# Patient Record
Sex: Female | Born: 1985 | Race: Black or African American | Hispanic: No | Marital: Married | Smoking: Never smoker
Health system: Southern US, Community
[De-identification: ages and names within clinical notes are randomized; demographics above are authoritative.]

## PROBLEM LIST (undated history)

## (undated) DIAGNOSIS — K219 Gastro-esophageal reflux disease without esophagitis: Secondary | ICD-10-CM

## (undated) DIAGNOSIS — D500 Iron deficiency anemia secondary to blood loss (chronic): Secondary | ICD-10-CM

## (undated) DIAGNOSIS — Z973 Presence of spectacles and contact lenses: Secondary | ICD-10-CM

## (undated) DIAGNOSIS — F411 Generalized anxiety disorder: Secondary | ICD-10-CM

## (undated) DIAGNOSIS — D573 Sickle-cell trait: Secondary | ICD-10-CM

## (undated) DIAGNOSIS — N83202 Unspecified ovarian cyst, left side: Secondary | ICD-10-CM

## (undated) DIAGNOSIS — D563 Thalassemia minor: Secondary | ICD-10-CM

## (undated) DIAGNOSIS — Z8659 Personal history of other mental and behavioral disorders: Secondary | ICD-10-CM

## (undated) DIAGNOSIS — D259 Leiomyoma of uterus, unspecified: Secondary | ICD-10-CM

## (undated) DIAGNOSIS — Z8742 Personal history of other diseases of the female genital tract: Secondary | ICD-10-CM

## (undated) DIAGNOSIS — N920 Excessive and frequent menstruation with regular cycle: Secondary | ICD-10-CM

## (undated) DIAGNOSIS — IMO0002 Reserved for concepts with insufficient information to code with codable children: Secondary | ICD-10-CM

## (undated) DIAGNOSIS — N946 Dysmenorrhea, unspecified: Secondary | ICD-10-CM

## (undated) DIAGNOSIS — I10 Essential (primary) hypertension: Secondary | ICD-10-CM

## (undated) DIAGNOSIS — R87619 Unspecified abnormal cytological findings in specimens from cervix uteri: Secondary | ICD-10-CM

## (undated) HISTORY — PX: NO PAST SURGERIES: SHX2092

## (undated) HISTORY — PX: COLPOSCOPY: SHX161

---
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
## 2004-01-04 ENCOUNTER — Encounter: Admission: RE | Admit: 2004-01-04 | Discharge: 2004-01-04 | Payer: Self-pay | Admitting: Family Medicine

## 2004-02-14 ENCOUNTER — Encounter: Admission: RE | Admit: 2004-02-14 | Discharge: 2004-02-14 | Payer: Self-pay | Admitting: Family Medicine

## 2004-02-14 ENCOUNTER — Other Ambulatory Visit: Admission: RE | Admit: 2004-02-14 | Discharge: 2004-02-14 | Payer: Self-pay | Admitting: Family Medicine

## 2004-02-14 ENCOUNTER — Encounter (INDEPENDENT_AMBULATORY_CARE_PROVIDER_SITE_OTHER): Payer: Self-pay | Admitting: Specialist

## 2004-02-28 ENCOUNTER — Encounter: Admission: RE | Admit: 2004-02-28 | Discharge: 2004-02-28 | Payer: Self-pay | Admitting: Family Medicine

## 2004-07-11 ENCOUNTER — Ambulatory Visit: Payer: Self-pay | Admitting: Family Medicine

## 2004-07-11 ENCOUNTER — Encounter (INDEPENDENT_AMBULATORY_CARE_PROVIDER_SITE_OTHER): Payer: Self-pay | Admitting: *Deleted

## 2004-10-17 ENCOUNTER — Encounter: Payer: Self-pay | Admitting: Family Medicine

## 2004-10-17 ENCOUNTER — Ambulatory Visit: Payer: Self-pay | Admitting: Family Medicine

## 2005-02-13 ENCOUNTER — Ambulatory Visit: Payer: Self-pay | Admitting: Family Medicine

## 2005-08-21 ENCOUNTER — Ambulatory Visit: Payer: Self-pay | Admitting: Family Medicine

## 2005-09-24 ENCOUNTER — Ambulatory Visit: Payer: Self-pay | Admitting: Family Medicine

## 2005-09-24 ENCOUNTER — Encounter: Payer: Self-pay | Admitting: Family Medicine

## 2005-10-31 ENCOUNTER — Emergency Department (HOSPITAL_COMMUNITY): Admission: EM | Admit: 2005-10-31 | Discharge: 2005-10-31 | Payer: Self-pay | Admitting: Emergency Medicine

## 2006-07-10 ENCOUNTER — Emergency Department (HOSPITAL_COMMUNITY): Admission: EM | Admit: 2006-07-10 | Discharge: 2006-07-10 | Payer: Self-pay | Admitting: Emergency Medicine

## 2008-05-16 ENCOUNTER — Ambulatory Visit (HOSPITAL_COMMUNITY): Admission: RE | Admit: 2008-05-16 | Discharge: 2008-05-16 | Payer: Self-pay | Admitting: Obstetrics & Gynecology

## 2008-05-29 ENCOUNTER — Ambulatory Visit (HOSPITAL_COMMUNITY): Admission: RE | Admit: 2008-05-29 | Discharge: 2008-05-29 | Payer: Self-pay | Admitting: Obstetrics & Gynecology

## 2008-06-25 ENCOUNTER — Ambulatory Visit (HOSPITAL_COMMUNITY): Admission: RE | Admit: 2008-06-25 | Discharge: 2008-06-25 | Payer: Self-pay | Admitting: Obstetrics & Gynecology

## 2008-06-25 IMAGING — US US OB FOLLOW-UP
1 series · 14 of 28 positions shown · non-contrast
Comparison: none

OBSTETRICAL ULTRASOUND:
 This ultrasound was performed in The [HOSPITAL], and the [facility] report will be stored to [REDACTED] PACS.

[Series 1: us ob follow-up · 14 of 67 slices shown]
[im 3/67]
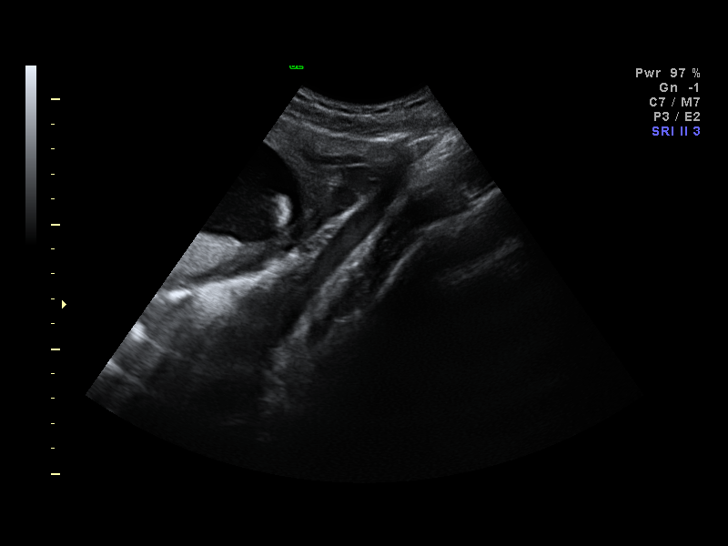
[im 8/67]
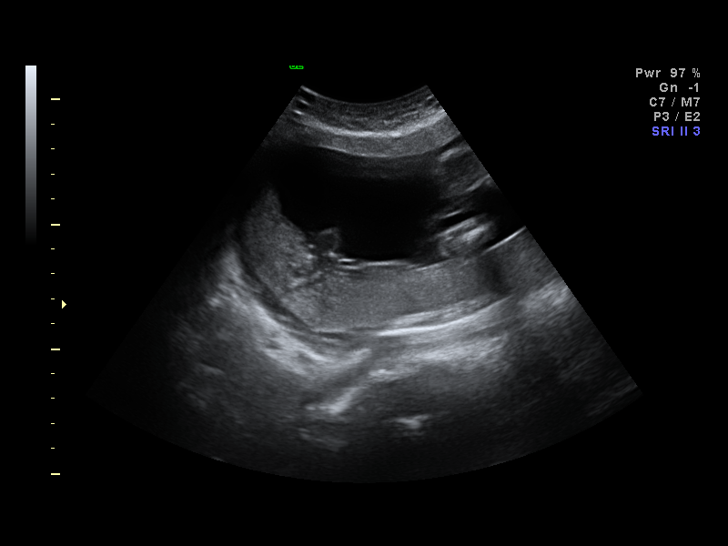
[im 13/67]
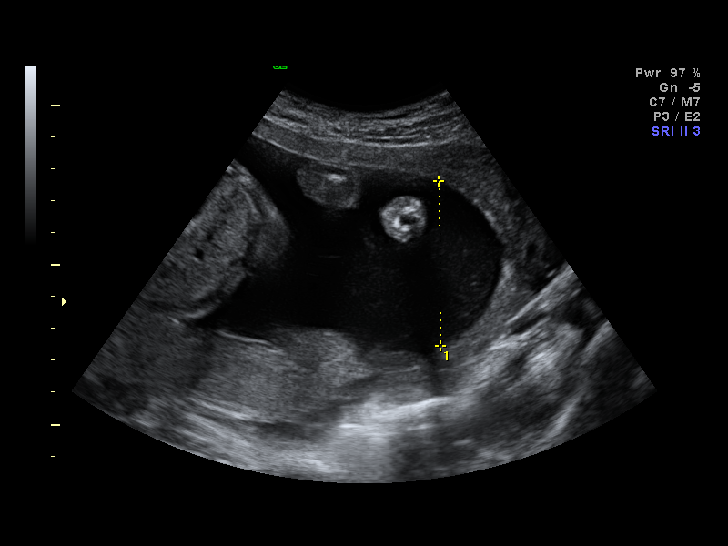
[im 18/67]
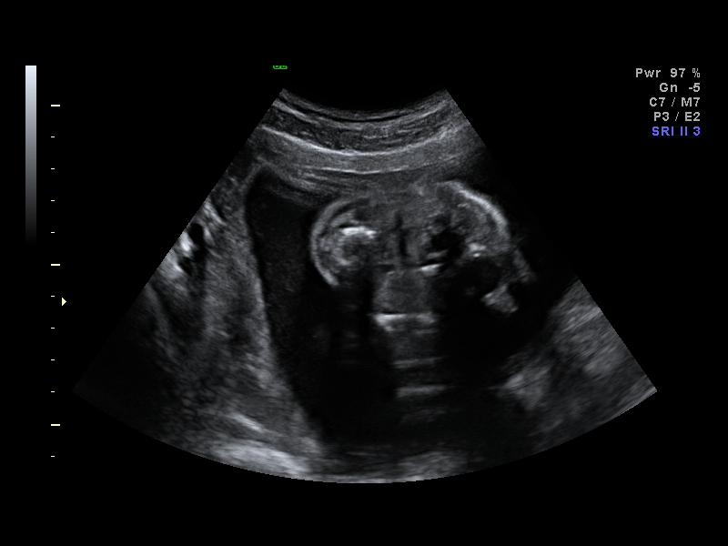
[im 23/67]
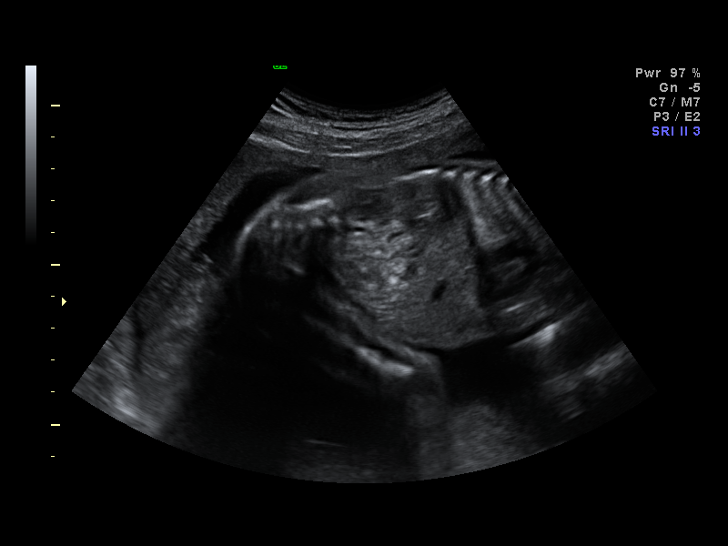
[im 27/67]
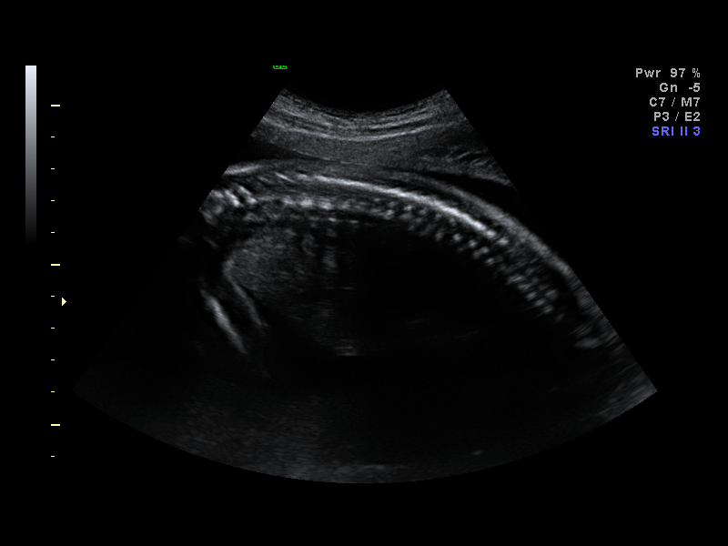
[im 32/67]
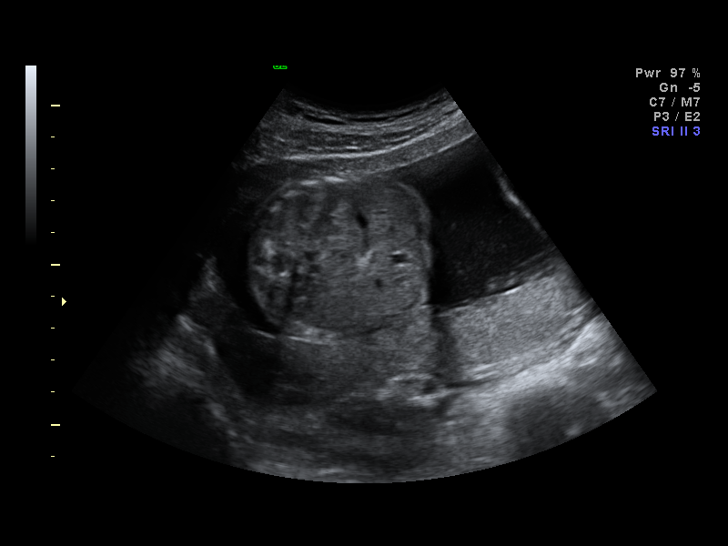
[im 37/67]
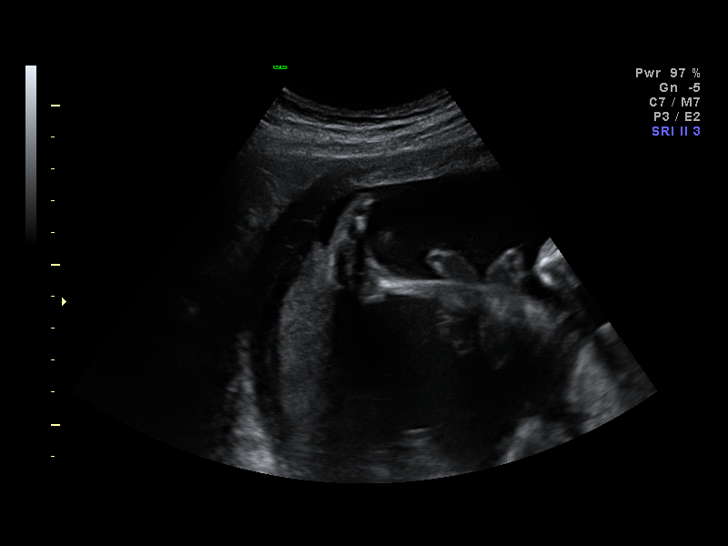
[im 42/67]
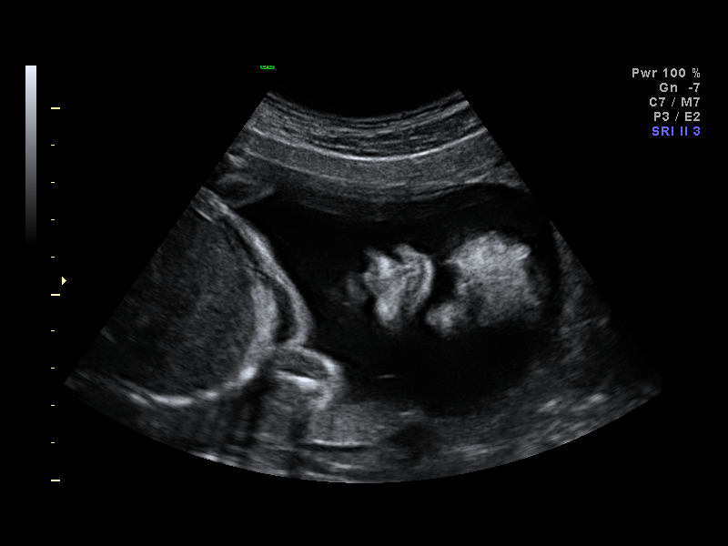
[im 47/67]
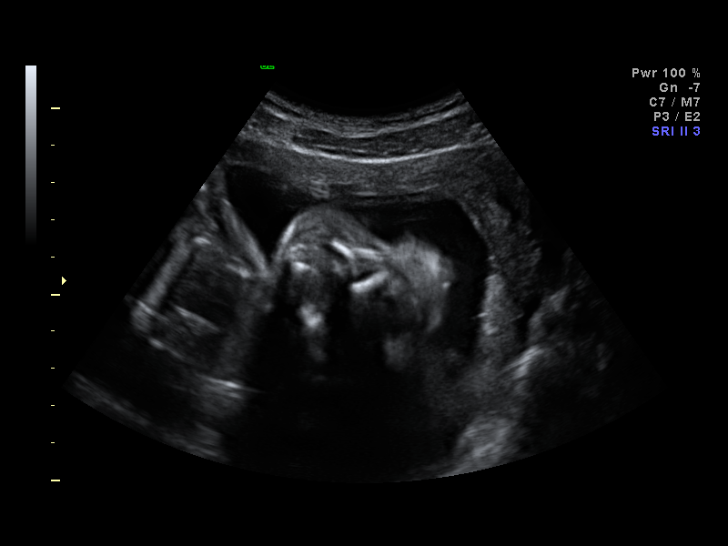
[im 52/67]
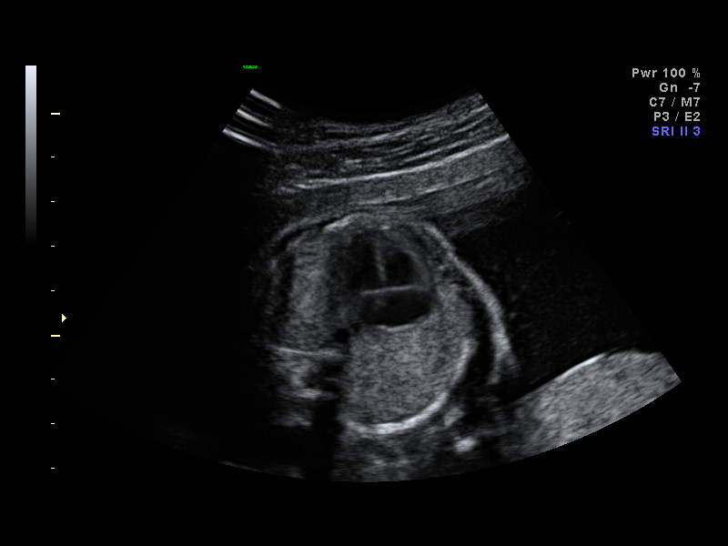
[im 57/67]
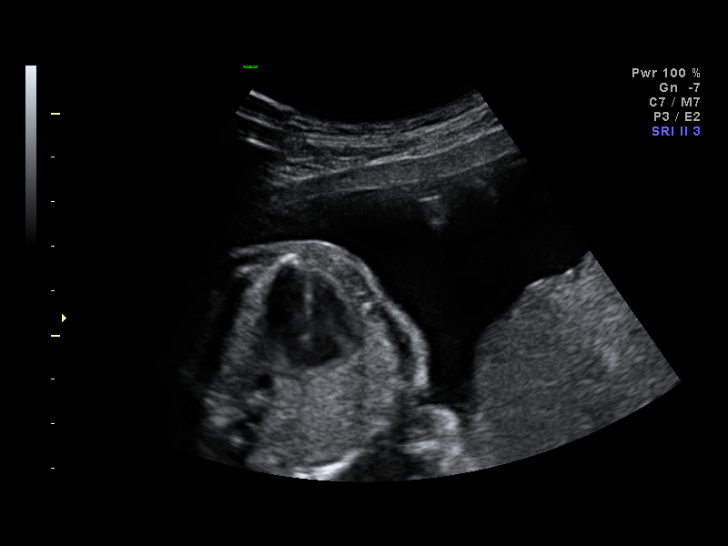
[im 62/67]
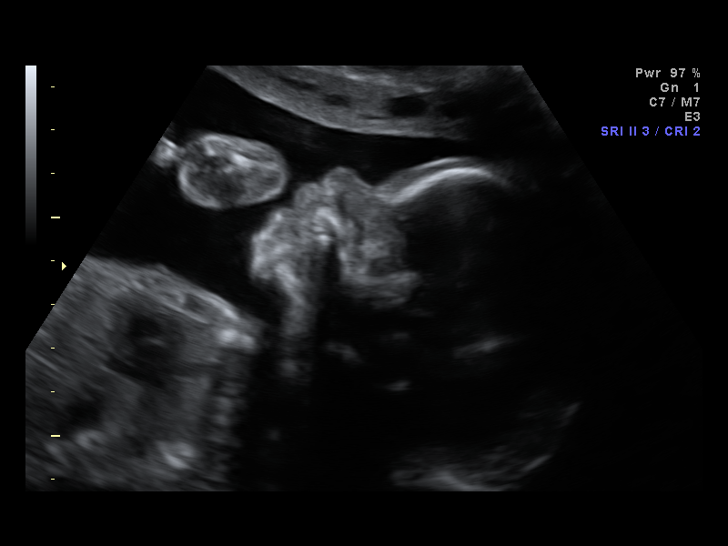
[im 67/67]
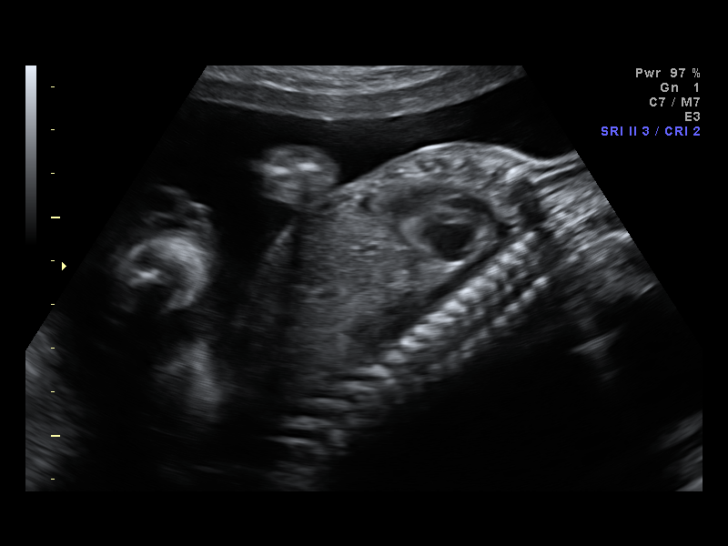

[14 of 28 positions shown; findings below may reference images not displayed]

IMPRESSION: [facility] has also been faxed to the ordering physician.

## 2008-10-14 ENCOUNTER — Inpatient Hospital Stay (HOSPITAL_COMMUNITY): Admission: AD | Admit: 2008-10-14 | Discharge: 2008-10-16 | Payer: Self-pay | Admitting: Obstetrics & Gynecology

## 2010-08-03 ENCOUNTER — Encounter: Payer: Self-pay | Admitting: Obstetrics & Gynecology

## 2010-10-22 LAB — COMPREHENSIVE METABOLIC PANEL
CO2: 22 mEq/L (ref 19–32)
Calcium: 8.8 mg/dL (ref 8.4–10.5)
Chloride: 104 mEq/L (ref 96–112)
Creatinine, Ser: 0.55 mg/dL (ref 0.4–1.2)
GFR calc non Af Amer: >60 mL/min (ref >60)
Glucose, Bld: 75 mg/dL (ref 70–99)
Total Bilirubin: 0.9 mg/dL (ref 0.3–1.2)
Total Protein: 7 g/dL (ref 6.0–8.3)

## 2010-10-22 LAB — CBC
HCT: 38.7 % (ref 36.0–46.0)
MCHC: 32.5 g/dL (ref 30.0–36.0)
MCV: 83.1 fL (ref 78.0–100.0)
MCV: 84.5 fL (ref 78.0–100.0)
Platelets: 150 10*3/uL (ref 150–400)
Platelets: 161 10*3/uL (ref 150–400)
RDW: 14.7 % (ref 11.5–15.5)
WBC: 6.5 10*3/uL (ref 4.0–10.5)

## 2010-10-22 LAB — LACTATE DEHYDROGENASE: LDH: 161 U/L (ref 94–250)

## 2010-11-28 NOTE — Group Therapy Note (Signed)
NAMENICHOLA, Ashley Stewart NO.:  [id number]   MEDICAL RECORD NO.:  [id number]          PATIENT TYPE:  WOC   LOCATION:  [facility]                   FACILITY:  [facility]   PHYSICIAN:  Tinnie Gens, MD        DATE OF BIRTH:  [birth year]   DATE OF SERVICE:                                    CLINIC NOTE   CHIEF COMPLAINT:  Repeat Pap smear.   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  She had a repeat Pap  in December which was normal.  She is here today for repeat.  She is without  complaints.   PHYSICAL EXAMINATION:  VITAL SIGNS:  Her vital signs are as noted on the  chart.  GENERAL:  She is a well-developed, well-nourished black female in no acute  distress.  ABDOMEN:  Soft, nontender, nondistended.  GU:  She has normal external female genitalia.  The vagina is pink and  rugated.  The cervix is without lesion.   IMPRESSION:  History of abnormal Paps status post LEEP for cervical  intraepithelial neoplasia, grade 3.   PLAN:  Review Pap today, and in August, if all of these are normal, she will  get every 6 months x 1 year and then yearly.      TP/MEDQ  D:  10/17/2004  T:  10/17/2004  Job:  [id number]

## 2010-11-28 NOTE — Group Therapy Note (Signed)
Ashley Stewart, Ashley Stewart NO.:  [id number]   MEDICAL RECORD NO.:  [id number]          PATIENT TYPE:  WOC   LOCATION:  [facility]                   FACILITY:  [facility]   PHYSICIAN:  Tinnie Gens, MD        DATE OF BIRTH:  1986-04-08   DATE OF SERVICE:  07/11/2004                                    CLINIC NOTE   CHIEF COMPLAINT:  Repeat Pap smear.   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  The patient returns for follow-up Pap smear today.  The patient is without complaints today.   PHYSICAL EXAMINATION:  VITAL SIGNS:  As noted in the chart.  GENERAL:  She is a well-developed, well-nourished black female in no acute  distress.  ABDOMEN:  Soft, nontender, nondistended.  GENITOURINARY:  She has normal external female genitalia.  The vagina is  pink and rugated.  The cervix is without lesion.   IMPRESSION:  History of severe dysplasia status post loop electrocautery  excision procedure August 2005.   PLAN:  Repeat Pap today.  If normal, follow-up Paps every 4 months.  If  abnormal, will reevaluate and call the patient.      TP/MEDQ  D:  07/11/2004  T:  07/11/2004  Job:  [id number]

## 2010-11-28 NOTE — Group Therapy Note (Signed)
NAME:  Ashley Stewart, Ashley Stewart NO.:  [id number]   MEDICAL RECORD NO.:  [id number]                   PATIENT TYPE:  OUT   LOCATION:  [facility]                           FACILITY:  [facility]   PHYSICIAN:  Argentina Donovan, MD                     DATE OF BIRTH:  20-Mar-1986   DATE OF SERVICE:  02/28/2004                                    CLINIC NOTE   REASON FOR VISIT:  The patient is a 25 year old black female who had a LEEP  done on February 14, 2004 for severe dysplasia which came in report showing CIN-  3 with clear margins.  Fully discussed with the patient and her partner the  necessity for strict follow-up with a Pap smear in 4 months and then  regularly after that depending on the results. Also, to use a condom until  we are back to normal Pap smears.  She seems to understand.  She is a  nonsmoker and we will bring her back in mid December for a repeat Pap.                                               Argentina Donovan, MD    PR/MEDQ  D:  02/28/2004  T:  02/28/2004  Job:  [id number]

## 2010-11-28 NOTE — Group Therapy Note (Signed)
NAMEHALCYON, HECK NO.:  [id number]   MEDICAL RECORD NO.:  [id number]          PATIENT TYPE:  WOC   LOCATION[facility]                   FACILITY:  [facility]   PHYSICIAN:  Tinnie Gens, MD        DATE OF BIRTH:  02-May-1986   DATE OF SERVICE:                                    CLINIC NOTE   CHIEF COMPLAINT:  Abnormal Pap and birth control consult.   HISTORY OF PRESENT ILLNESS:  Patient is a 25 year old nulligravida, who had  a history of CIN-3 and underwent LEEP procedure in August of 2005.  Her last  Pap was 6 months ago and was normal.  She is here for her last 45-month Pap  and then can go back to yearly.   The patient is also here today desiring birth control.  She is getting  married in September and would like to start birth control.  She thinks she  would like to get on pills.   PHYSICAL EXAMINATION:  VITAL SIGNS:  As noted in the chart.  GENERAL: She is a well-developed, well-nourished black female in no acute  distress.  ABDOMEN:  Soft, nontender and nondistended.  GENITOURINARY:  Normal external female genitalia.  BUS normal.  The vagina  is pink and rugated.  The cervix has an old LEEP scar, but is otherwise  normal.  The uterus is small and anteverted.  The adnexa are without mass or  tenderness.   IMPRESSION:  1.  History of cervical intraepithelial neoplasia 3 status post LEEP.  Last      Pap smear normal in August of 2006, for followup today.  If this is      normal, we will go back to yearly Paps.  2.  Birth control consult.  Discussed at length with this patient the      options but birth control including Depo, the pill, the patch, the ring      and IUD.  Currently, the patient just wants to start with the pill.  We      will put her on Nordette because it has the least andronergic side      effects.  She will start on the Sunday after her next cycle starts.  She      will follow up if she is having any problems with this and if not, we   will see her back in a year.           ______________________________  Tinnie Gens, MD     TP/MEDQ  D:  09/24/2005  T:  09/24/2005  Job:  [id number]

## 2010-11-28 NOTE — Group Therapy Note (Signed)
NAME:  Ashley Stewart, Ashley Stewart                         ACCOUNT NO.:  [id number]   MEDICAL RECORD NO.:  [id number]                   PATIENT TYPE:  OUT   LOCATION:  [facility]                           FACILITY:  [facility]   PHYSICIAN:  Tinnie Gens, MD                     DATE OF BIRTH:  16-Jan-1986   DATE OF SERVICE:  02/14/2004                                    CLINIC NOTE   CHIEF COMPLAINT:  Abnormal Pap smear.   HISTORY:  The patient is a 25 year old G0 who came in for a CIN III on a  Pap.  Her colposcopic biopsy only revealed CIN I so she is here for a LEEP  for definitive diagnosis as well as treatment.   PROCEDURE:  The patient in dorsal lithotomy.  A coated speculum was placed  inside the vagina.  The cervix was covered in acetic acid.  No significant  abnormalities were found.  A LEEP of the entire transitional zone as well as  part of the ectocervix was done after injection with 8 mL of lidocaine.  Afterwards, the bed of the LEEP was cauterized with the electrocautery and  Monsel applied until hemostasis was obtained.  When hemostasis was adequate  all instruments were removed from the vagina.  The patient tolerated  procedure well.  She will return in two weeks for results and  recommendations regarding follow-up Paps.                                               Tinnie Gens, MD    TP/MEDQ  D:  02/14/2004  T:  02/15/2004  Job:  [id number]

## 2010-11-28 NOTE — Group Therapy Note (Signed)
NAME:  Ashley Stewart, Ashley Stewart NO.:  [id number]   MEDICAL RECORD NO.:  [id number]                   PATIENT TYPE:  OUT   LOCATION[facility]                           [facility]ACILITY[facility]   PHYSICIAN:  Tinnie Gens, MD                     DATE OF BIRTH:  Mar 25, 1986   DATE OF SERVICE:  11/08/2003                                    CLINIC NOTE   REASON FOR VISIT:  This is a pleasant 25 year old African-American female  with no significant Past Medical History.  She is here with chief complaint  of right breast lump which appeared earlier this year in January while she  was taking a shower incidentally.  She had been following it over the last  few months and states that it has been changing in size with correlation to  her menstrual cycles.  She is currently not sexually active, has been  abstinent for the last 8 months.  This breast lump has no skin change  associated with it, there is no pain associated with it.  She denies any  nipple discharge.  She has normal menstrual cycles which her last was last  week lasting 7 days which is her usual.  She denies any family history of  breast cancer, cervical cancer, or ovarian cancer.   She also states she has never had a Pap smear and has had one female sexual  partner.  Last intercourse was approximately 8 months ago.  Since then, she  is not on any oral contraceptive pills or any sort of contraception as she  has remained abstinent.  She denies any discharge, fevers, chills,  constipation, diarrhea, malaise, weight changes, abdominal pain, chest pain,  or shortness of breath.   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  She denies tobacco, alcohol, or IV drug use.   PHYSICAL EXAMINATION:  HEENT:  Unremarkable.  CARDIOVASCULAR:  Regular rate and rhythm.  No murmurs, rubs, or gallops.  ABDOMEN:  Soft, nontender, nondistended.  Positive bowel sounds.  EXTREMITIES:  Without clubbing,  cyanosis, or edema.  She has got good 2+  pulses bilaterally upper and lower extremities.  GENITOURINARY:  She has normal appearance, normal genitalia, no lesions  noted.  On speculum exam, she has a normal-appearing cervix.  Pap smear was  done.  She has no cervical motion tenderness and no fistulas.  BREASTS:  Normal fibroglandular tissue noted with no nipple discharge.  There was no skin tenting or skin changes, erythema, or warmth noted in  either of her breasts.   ASSESSMENT AND PLAN:  1. Breast lump--the patient has normal-appearing breasts with no physical     findings that would be worrisome for further workup.  Explained to the     patient red flags that would necessitate her returning to the clinic for     medical evaluation.  2.  Pap smear--specimens were sent and she will be notified when she can come     and pick those up in approximately 2 weeks.   DISPOSITION:  Return to clinic p.r.n.  She does have a primary care  physician in [place] is her permanent residence.     Tinnie Gens, MD                           Tinnie Gens, MD    TP/MEDQ  D:  11/08/2003  T:  11/08/2003  Job:  [id number]

## 2010-12-05 ENCOUNTER — Other Ambulatory Visit: Payer: Self-pay | Admitting: Family Medicine

## 2010-12-12 ENCOUNTER — Other Ambulatory Visit: Payer: Self-pay

## 2011-03-24 ENCOUNTER — Other Ambulatory Visit: Payer: Self-pay

## 2011-03-24 ENCOUNTER — Ambulatory Visit
Admission: RE | Admit: 2011-03-24 | Discharge: 2011-03-24 | Disposition: A | Discharge status: Home / Self Care | Payer: Private Health Insurance - Indemnity | Source: Ambulatory Visit | Attending: Family Medicine | Admitting: Family Medicine

## 2011-03-24 MED ORDER — GADOBENATE DIMEGLUMINE 529 MG/ML IV SOLN
13.0000 mL | Freq: Once | INTRAVENOUS | Status: AC | PRN
  Administered 2011-03-24: 13 mL via INTRAVENOUS

## 2011-12-29 ENCOUNTER — Encounter (HOSPITAL_COMMUNITY): Payer: Self-pay | Admitting: *Deleted

## 2011-12-29 ENCOUNTER — Encounter (HOSPITAL_COMMUNITY): Payer: Self-pay | Admitting: Anesthesiology

## 2011-12-29 ENCOUNTER — Encounter (HOSPITAL_COMMUNITY): Payer: Self-pay

## 2011-12-29 ENCOUNTER — Inpatient Hospital Stay (HOSPITAL_COMMUNITY)
Admission: AD | Admit: 2011-12-29 | Discharge: 2011-12-31 | DRG: 775 | Disposition: A | Discharge status: Home / Self Care | Payer: Managed Care, Other (non HMO) | Source: Ambulatory Visit | Attending: Obstetrics and Gynecology | Admitting: Obstetrics and Gynecology

## 2011-12-29 ENCOUNTER — Inpatient Hospital Stay (HOSPITAL_COMMUNITY): Payer: Managed Care, Other (non HMO) | Admitting: Anesthesiology

## 2011-12-29 DIAGNOSIS — O9902 Anemia complicating childbirth: Principal | ICD-10-CM | POA: Diagnosis present

## 2011-12-29 DIAGNOSIS — D649 Anemia, unspecified: Secondary | ICD-10-CM | POA: Diagnosis present

## 2011-12-29 HISTORY — DX: Reserved for concepts with insufficient information to code with codable children: IMO0002

## 2011-12-29 HISTORY — DX: Unspecified abnormal cytological findings in specimens from cervix uteri: R87.619

## 2011-12-29 LAB — PROTIME-INR
INR: 1.1 (ref 0.00–1.49)
Prothrombin Time: 14.4 seconds (ref 11.6–15.2)

## 2011-12-29 LAB — CBC
HCT: 24.9 % — ABNORMAL LOW (ref 36.0–46.0)
Hemoglobin: 7.8 g/dL — ABNORMAL LOW (ref 12.0–15.0)
MCH: 22.3 pg — ABNORMAL LOW (ref 26.0–34.0)
MCV: 71.3 fL — ABNORMAL LOW (ref 78.0–100.0)
RBC: 3.49 MIL/uL — ABNORMAL LOW (ref 3.87–5.11)

## 2011-12-29 LAB — OB RESULTS CONSOLE RPR: RPR: NONREACTIVE

## 2011-12-29 LAB — OB RESULTS CONSOLE HEPATITIS B SURFACE ANTIGEN: Hepatitis B Surface Ag: NEGATIVE

## 2011-12-29 LAB — OB RESULTS CONSOLE GBS: GBS: NEGATIVE

## 2011-12-29 LAB — OB RESULTS CONSOLE HIV ANTIBODY (ROUTINE TESTING): HIV: NONREACTIVE

## 2011-12-29 LAB — OB RESULTS CONSOLE ABO/RH: RH Type: POSITIVE

## 2011-12-29 LAB — OB RESULTS CONSOLE GC/CHLAMYDIA: Chlamydia: NEGATIVE

## 2011-12-29 MED ORDER — FENTANYL 2.5 MCG/ML BUPIVACAINE 1/10 % EPIDURAL INFUSION (WH - ANES)
14.0000 mL/h | INTRAMUSCULAR | Status: DC
  Filled 2011-12-29: qty 60

## 2011-12-29 MED ORDER — SENNOSIDES-DOCUSATE SODIUM 8.6-50 MG PO TABS
2.0000 | ORAL_TABLET | Freq: Every day | ORAL | Status: DC
  Administered 2011-12-29 – 2011-12-30 (×2): 2 via ORAL

## 2011-12-29 MED ORDER — SIMETHICONE 80 MG PO CHEW: 80.0000 mg | CHEWABLE_TABLET | ORAL | Status: DC | PRN

## 2011-12-29 MED ORDER — IBUPROFEN 600 MG PO TABS: 600.0000 mg | ORAL_TABLET | Freq: Four times a day (QID) | ORAL | Status: DC

## 2011-12-29 MED ORDER — TETANUS-DIPHTH-ACELL PERTUSSIS 5-2.5-18.5 LF-MCG/0.5 IM SUSP: 0.5000 mL | Freq: Once | INTRAMUSCULAR | Status: DC

## 2011-12-29 MED ORDER — MEASLES, MUMPS & RUBELLA VAC ~~LOC~~ INJ: 0.5000 mL | INJECTION | Freq: Once | SUBCUTANEOUS | Status: DC

## 2011-12-29 MED ORDER — LACTATED RINGERS IV SOLN: INTRAVENOUS | Status: AC

## 2011-12-29 MED ORDER — LACTATED RINGERS IV SOLN
500.0000 mL | Freq: Once | INTRAVENOUS | Status: DC
Start: 2011-12-29 — End: 2011-12-29

## 2011-12-29 MED ORDER — OXYTOCIN 40 UNITS IN LACTATED RINGERS INFUSION - SIMPLE MED
62.5000 mL/h | Freq: Once | INTRAVENOUS | Status: AC
  Administered 2011-12-29: 250 [IU] via INTRAVENOUS
  Filled 2011-12-29: qty 1000

## 2011-12-29 MED ORDER — DIBUCAINE 1 % RE OINT: 1.0000 "application " | TOPICAL_OINTMENT | RECTAL | Status: DC | PRN

## 2011-12-29 MED ORDER — LACTATED RINGERS IV SOLN: INTRAVENOUS | Status: DC

## 2011-12-29 MED ORDER — ZOLPIDEM TARTRATE 5 MG PO TABS: 5.0000 mg | ORAL_TABLET | Freq: Every evening | ORAL | Status: DC | PRN

## 2011-12-29 MED ORDER — SENNOSIDES-DOCUSATE SODIUM 8.6-50 MG PO TABS: 2.0000 | ORAL_TABLET | Freq: Every day | ORAL | Status: DC

## 2011-12-29 MED ORDER — MEASLES, MUMPS & RUBELLA VAC ~~LOC~~ INJ
0.5000 mL | INJECTION | Freq: Once | SUBCUTANEOUS | Status: DC
  Filled 2011-12-29: qty 0.5

## 2011-12-29 MED ORDER — CITRIC ACID-SODIUM CITRATE 334-500 MG/5ML PO SOLN: 30.0000 mL | ORAL | Status: DC | PRN

## 2011-12-29 MED ORDER — DIPHENHYDRAMINE HCL 25 MG PO CAPS: 25.0000 mg | ORAL_CAPSULE | Freq: Four times a day (QID) | ORAL | Status: DC | PRN

## 2011-12-29 MED ORDER — PHENYLEPHRINE 40 MCG/ML (10ML) SYRINGE FOR IV PUSH (FOR BLOOD PRESSURE SUPPORT)
80.0000 ug | PREFILLED_SYRINGE | INTRAVENOUS | Status: DC | PRN
  Filled 2011-12-29: qty 5

## 2011-12-29 MED ORDER — EPHEDRINE 5 MG/ML INJ
10.0000 mg | INTRAVENOUS | Status: DC | PRN
  Filled 2011-12-29: qty 4

## 2011-12-29 MED ORDER — LACTATED RINGERS IV SOLN: 500.0000 mL | INTRAVENOUS | Status: DC | PRN

## 2011-12-29 MED ORDER — ONDANSETRON HCL 4 MG/2ML IJ SOLN: 4.0000 mg | INTRAMUSCULAR | Status: DC | PRN

## 2011-12-29 MED ORDER — LANOLIN HYDROUS EX OINT: TOPICAL_OINTMENT | CUTANEOUS | Status: DC | PRN

## 2011-12-29 MED ORDER — OXYCODONE-ACETAMINOPHEN 5-325 MG PO TABS: 1.0000 | ORAL_TABLET | ORAL | Status: DC | PRN

## 2011-12-29 MED ORDER — EPHEDRINE 5 MG/ML INJ: 10.0000 mg | INTRAVENOUS | Status: DC | PRN

## 2011-12-29 MED ORDER — OXYTOCIN 40 UNITS IN LACTATED RINGERS INFUSION - SIMPLE MED: 1.0000 m[IU]/min | INTRAVENOUS | Status: DC

## 2011-12-29 MED ORDER — LIDOCAINE HCL (PF) 1 % IJ SOLN
INTRAMUSCULAR | Status: DC | PRN
  Administered 2011-12-29: 5 mL
  Administered 2011-12-29: 4 mL

## 2011-12-29 MED ORDER — OXYTOCIN 40 UNITS IN LACTATED RINGERS INFUSION - SIMPLE MED: 62.5000 mL/h | INTRAVENOUS | Status: AC

## 2011-12-29 MED ORDER — TERBUTALINE SULFATE 1 MG/ML IJ SOLN: 0.2500 mg | Freq: Once | INTRAMUSCULAR | Status: DC | PRN

## 2011-12-29 MED ORDER — OXYCODONE-ACETAMINOPHEN 5-325 MG PO TABS
1.0000 | ORAL_TABLET | ORAL | Status: DC | PRN
  Administered 2011-12-29: 1 via ORAL
  Filled 2011-12-29: qty 1

## 2011-12-29 MED ORDER — IBUPROFEN 600 MG PO TABS: 600.0000 mg | ORAL_TABLET | Freq: Four times a day (QID) | ORAL | Status: DC | PRN

## 2011-12-29 MED ORDER — PRENATAL MULTIVITAMIN CH
1.0000 | ORAL_TABLET | Freq: Every day | ORAL | Status: DC
  Administered 2011-12-29 – 2011-12-30 (×2): 1 via ORAL
  Filled 2011-12-29: qty 1

## 2011-12-29 MED ORDER — DIPHENHYDRAMINE HCL 50 MG/ML IJ SOLN: 12.5000 mg | INTRAMUSCULAR | Status: DC | PRN

## 2011-12-29 MED ORDER — BENZOCAINE-MENTHOL 20-0.5 % EX AERO: 1.0000 "application " | INHALATION_SPRAY | CUTANEOUS | Status: DC | PRN

## 2011-12-29 MED ORDER — WITCH HAZEL-GLYCERIN EX PADS: 1.0000 "application " | MEDICATED_PAD | CUTANEOUS | Status: DC | PRN

## 2011-12-29 MED ORDER — LIDOCAINE HCL (PF) 1 % IJ SOLN
30.0000 mL | INTRAMUSCULAR | Status: DC | PRN
  Filled 2011-12-29: qty 30

## 2011-12-29 MED ORDER — IBUPROFEN 600 MG PO TABS
600.0000 mg | ORAL_TABLET | Freq: Four times a day (QID) | ORAL | Status: DC
  Administered 2011-12-29 – 2011-12-31 (×9): 600 mg via ORAL
  Filled 2011-12-29 (×9): qty 1

## 2011-12-29 MED ORDER — ACETAMINOPHEN 325 MG PO TABS: 650.0000 mg | ORAL_TABLET | ORAL | Status: DC | PRN

## 2011-12-29 MED ORDER — ONDANSETRON HCL 4 MG PO TABS: 4.0000 mg | ORAL_TABLET | ORAL | Status: DC | PRN

## 2011-12-29 MED ORDER — ONDANSETRON HCL 4 MG/2ML IJ SOLN: 4.0000 mg | Freq: Four times a day (QID) | INTRAMUSCULAR | Status: DC | PRN

## 2011-12-29 MED ORDER — FLEET ENEMA 7-19 GM/118ML RE ENEM: 1.0000 | ENEMA | RECTAL | Status: DC | PRN

## 2011-12-29 MED ORDER — PHENYLEPHRINE 40 MCG/ML (10ML) SYRINGE FOR IV PUSH (FOR BLOOD PRESSURE SUPPORT): 80.0000 ug | PREFILLED_SYRINGE | INTRAVENOUS | Status: DC | PRN

## 2011-12-29 MED ORDER — FENTANYL 2.5 MCG/ML BUPIVACAINE 1/10 % EPIDURAL INFUSION (WH - ANES)
INTRAMUSCULAR | Status: DC | PRN
  Administered 2011-12-29: 15 mL/h via EPIDURAL

## 2011-12-29 MED ORDER — PRENATAL MULTIVITAMIN CH: 1.0000 | ORAL_TABLET | Freq: Every day | ORAL | Status: DC

## 2011-12-29 MED ORDER — OXYTOCIN BOLUS FROM INFUSION
250.0000 mL | Freq: Once | INTRAVENOUS | Status: DC
  Filled 2011-12-29: qty 500

## 2011-12-29 NOTE — Progress Notes (Signed)
Patient ID: Ashley Stewart, female   DOB: [birth year], 26 y.o.   MRN: [id number] Delivery note:  The pt became fully dilated and pushed fairly well but continued to ask for the baby to be pulled out. Finally, after much coaxing to keep pushing she delivered a living female infant spontaneously OA over an intact perineum. The weight is pending. Apgars were 9 and 9 at 1 and 5 minutes. The placenta was removed intact and the uterus was normal. There were no lacerations. EBL 400 cc's.

## 2011-12-29 NOTE — Anesthesia Procedure Notes (Signed)
Epidural Patient location during procedure: OB Start time: 12/29/2011 4:45 AM  Staffing Anesthesiologist: Marguerita Stapp A. Performed by: anesthesiologist   Preanesthetic Checklist Completed: patient identified, site marked, surgical consent, pre-op evaluation, timeout performed, IV checked, risks and benefits discussed and monitors and equipment checked  Epidural Patient position: sitting Prep: site prepped and draped and DuraPrep Patient monitoring: continuous pulse ox and blood pressure Approach: midline Injection technique: LOR air  Needle:  Needle type: Tuohy  Needle gauge: 17 G Needle length: 9 cm Needle insertion depth: 5 cm cm Catheter type: closed end flexible Catheter size: 19 Gauge Catheter at skin depth: 10 cm Test dose: negative and Other  Assessment Events: blood not aspirated, injection not painful, no injection resistance, negative IV test and no paresthesia  Additional Notes Patient identified. Risks and benefits discussed including failed block, incomplete  Pain control, post dural puncture headache, nerve damage, paralysis, blood pressure Changes, nausea, vomiting, reactions to medications-both toxic and allergic and post Partum back pain. All questions were answered. Patient expressed understanding and wished to proceed. Sterile technique was used throughout procedure. Epidural site was Dressed with sterile barrier dressing. No paresthesias, signs of intravascular injection Or signs of intrathecal spread were encountered.  Patient was more comfortable after the epidural was dosed. Please see RN's note for documentation of vital signs and FHR which are stable.

## 2011-12-29 NOTE — Anesthesia Preprocedure Evaluation (Signed)

## 2011-12-29 NOTE — Progress Notes (Signed)
Patient ID: Ashley Stewart, female   DOB: 1986/04/02, 26 y.o.   MRN: [id number] Contractions are q 2 and 1/2 to 3 and 1/2 minutes apart and the cervix is 8-9 cm. The membranes are bulging and AROM produced clear fluid. The vertex is at -1 station.
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
## 2011-12-30 LAB — CBC
HCT: 22.8 % — ABNORMAL LOW (ref 36.0–46.0)
Hemoglobin: 6.9 g/dL — CL (ref 12.0–15.0)
MCH: 21.6 pg — ABNORMAL LOW (ref 26.0–34.0)
MCHC: 30.3 g/dL (ref 30.0–36.0)
RBC: 3.2 MIL/uL — ABNORMAL LOW (ref 3.87–5.11)

## 2011-12-30 NOTE — Progress Notes (Signed)
CRITICAL VALUE ALERT  Critical value received:  HGB 6.9   Date of notification:  12/30/2011  Time of notification:  0555  Critical value read back:yes  Nurse who received alert:  Gerrie Nordmann, RN  MD notified (1st page):  Dr. Ellyn Hack   Time of first page:  [phone]  MD notified (2nd page):  Time of second page:  Responding MD:  Dr. Ellyn Hack  Time MD responded:  0600

## 2011-12-30 NOTE — Progress Notes (Signed)
Patient ID: Ashley Stewart, female   DOB: 07-Nov-1985, 26 y.o.   MRN: [id number] #1 afebrile HGB low but stable

## 2011-12-30 NOTE — Anesthesia Postprocedure Evaluation (Signed)
  Anesthesia Post-op Note  Patient: Ashley Stewart  Procedure(s) Performed: * No procedures listed *  Patient Location: Mother/Baby  Anesthesia Type: Epidural  Level of Consciousness: awake, alert  and oriented  Airway and Oxygen Therapy: Patient Spontanous Breathing  Post-op Pain: mild  Post-op Assessment: Post-op Vital signs reviewed and Patient's Cardiovascular Status Stable  Post-op Vital Signs: Reviewed and stable  Complications: No apparent anesthesia complications

## 2011-12-31 MED ORDER — IBUPROFEN 600 MG PO TABS: 600.0000 mg | ORAL_TABLET | Freq: Four times a day (QID) | ORAL | Status: AC

## 2011-12-31 NOTE — Discharge Instructions (Signed)
booklet °

## 2011-12-31 NOTE — Progress Notes (Signed)
Patient ID: Ashley Stewart, female   DOB: 02-17-1986, 26 y.o.   MRN: [id number] #[id number] afebrile BP normal For d/c.

## 2011-12-31 NOTE — Discharge Summary (Signed)
[name], [name] NO.:  [id number]  MEDICAL RECORD NO.:  [id number]  LOCATION:  9134                          [facility]ILITY[facility]  PHYSICIAN:  Malachi Pro. Ambrose Mantle, M.D. DATE OF BIRTH:  [birth year]  DATE OF ADMISSION:  12/29/2011 DATE OF DISCHARGE:  12/31/2011                              DISCHARGE SUMMARY   This is a 26 year old black female, para 1-0-0-1, gravida 2, EDC January 01, 2012 admitted with advanced labor.  Blood group and type B positive, negative antibody.  Pap smear normal.  Rubella immune.  RPR nonreactive. Urine culture negative.  Hepatitis B surface antigen negative.  HIV negative.  GC and Chlamydia negative.  Hemoglobin electrophoresis AS. Quad screen negative.  1-hour Glucola 124 and group B strep negative. The patient came to the Maternity Admission Unit 6 cm dilated after an uncomplicated prenatal course.  After admission to the hospital, she received her epidural, became fully dilated, pushed fairly well, but continued to ask for the baby to be pulled out.  Finally, after much coaxing to keep pushing, she delivered a living female infant spontaneously OA over an intact perineum.  A living female infant with Apgars of 9 and 9 at one and five minutes.  The weight of the baby was 8 pounds and 3 ounces.  Placenta was removed intact.  Uterus was normal. No lacerations were present.  Blood loss about 400 mL postpartum.  The patient did quite well and was discharged on the 2nd postpartum day.  LABORATORY DATA:  Showed initial hemoglobin of less than 8 g.  Followup hemoglobin was 6.9, hematocrit 22.8, white count 7100, platelet count 169,100.  The patient was typed and screened, but transfusion was not necessary.  On the 2nd postpartum day, she was afebrile.  Blood pressure was normal.  She was doing well and was ready for discharge.  FINAL DIAGNOSES:  Intrauterine pregnancy 39+ weeks, delivered OA, significant anemia.  OPERATIONS:  Spontaneous  delivery, OA.  FINAL CONDITION:  Improved.  INSTRUCTIONS:  Include our regular discharge instruction booklet as well as after visit summary, prescription for Motrin 600 mg, 30 tablets, 1 every 6 hours as needed for pain and she is advised to take ferrous sulfate 325 mg twice daily for 3 months.  She is also advised to avoid eating flour like she had done during the pregnancy.     Malachi Pro. Ambrose Mantle, M.D.     TFH/MEDQ  D:  12/31/2011  T:  12/31/2011  Job:  [id number]

## 2012-01-01 ENCOUNTER — Inpatient Hospital Stay (HOSPITAL_COMMUNITY)
Admission: RE | Admit: 2012-01-01 | Payer: Private Health Insurance - Indemnity | Source: Ambulatory Visit | Admitting: Obstetrics & Gynecology

## 2012-01-01 LAB — TYPE AND SCREEN
Antibody Screen: NEGATIVE
Unit division: 0
Unit division: 0

## 2012-09-09 ENCOUNTER — Emergency Department (HOSPITAL_COMMUNITY): Payer: Managed Care, Other (non HMO)

## 2012-09-09 ENCOUNTER — Emergency Department (HOSPITAL_COMMUNITY)
Admission: EM | Admit: 2012-09-09 | Discharge: 2012-09-09 | Disposition: A | Discharge status: Home / Self Care | Payer: Managed Care, Other (non HMO) | Attending: Emergency Medicine | Admitting: Emergency Medicine

## 2012-09-09 DIAGNOSIS — R209 Unspecified disturbances of skin sensation: Secondary | ICD-10-CM | POA: Insufficient documentation

## 2012-09-09 DIAGNOSIS — R002 Palpitations: Secondary | ICD-10-CM | POA: Insufficient documentation

## 2012-09-09 DIAGNOSIS — R000 Tachycardia, unspecified: Secondary | ICD-10-CM | POA: Insufficient documentation

## 2012-09-09 DIAGNOSIS — H538 Other visual disturbances: Secondary | ICD-10-CM | POA: Insufficient documentation

## 2012-09-09 DIAGNOSIS — R0789 Other chest pain: Secondary | ICD-10-CM | POA: Insufficient documentation

## 2012-09-09 LAB — POCT I-STAT, CHEM 8
BUN: 10 mg/dL (ref 6–23)
Calcium, Ion: 1.23 mmol/L (ref 1.12–1.23)
Chloride: 107 mEq/L (ref 96–112)
Creatinine, Ser: 0.7 mg/dL (ref 0.50–1.10)
TCO2: 24 mmol/L (ref 0–100)

## 2012-09-09 MED ORDER — METOPROLOL TARTRATE 1 MG/ML IV SOLN
5.0000 mg | Freq: Once | INTRAVENOUS | Status: AC
  Administered 2012-09-09: 5 mg via INTRAVENOUS
  Filled 2012-09-09: qty 5

## 2012-09-09 NOTE — ED Notes (Signed)
Per EMS, patient from home c/o chest tightness.  Patient felt her "heart racing" and "felt flushed".  EMS reports that patient was in SVT with a rate 180.  Vagal Maneuver was successful and patient's HR came down to 135. Patient is complaining of chest tightness at this time and denies chest pain.

## 2012-09-09 NOTE — ED Provider Notes (Signed)
History     CSN: [id number]  Arrival date & time 09/09/12  2028   First MD Initiated Contact with Patient 09/09/12 2040      Chief Complaint  Patient presents with  . Chest Pain    (Consider location/radiation/quality/duration/timing/severity/associated sxs/prior treatment) HPI Comments: Pt was at dinner started feeling funny. Felt like heart was pounding. No vomiting. Admits numbness on face, numbness and tingling down to finger tips. Not pain, not pressure a "fluttering". Admits blurry vision.   Patient is a 27 y.o. female presenting with palpitations Severity: severe Onset: gradula quality: fluttering Duration: 2 hours  Timing: Constant  Progression: worsening Relieved by: nothing Worsened by: Nothing tried  Ineffective treatments: None tried    Patient is a 27 y.o. female presenting with chest pain.  Chest Pain Associated symptoms: palpitations   Associated symptoms: no abdominal pain, no diaphoresis, no dizziness, no fever, no headache, no nausea, no numbness, no shortness of breath, not vomiting and no weakness     Past Medical History  Diagnosis Date  . Pregnancy induced hypertension     G1  . Abnormal Pap smear     Past Surgical History  Procedure Laterality Date  . Colposcopy      No family history on file.  History  Substance Use Topics  . Smoking status: Never Smoker   . Smokeless tobacco: Not on file  . Alcohol Use: No    OB History   Grav Para Term Preterm Abortions TAB SAB Ect Mult Living   2 2 2       2       Review of Systems  Constitutional: Negative for fever and diaphoresis.  HENT: Negative for neck pain and neck stiffness.   Eyes: Positive for visual disturbance. Negative for photophobia.       Blurry vision  Respiratory: Positive for chest tightness. Negative for apnea and shortness of breath.   Cardiovascular: Positive for palpitations. Negative for chest pain.  Gastrointestinal: Negative for nausea, vomiting, abdominal pain,  diarrhea and constipation.  Genitourinary: Negative for dysuria.  Musculoskeletal: Negative for gait problem.  Skin: Negative for rash.  Neurological: Negative for dizziness, weakness, light-headedness, numbness and headaches.  Psychiatric/Behavioral:       Anxious    Allergies  Review of patient's allergies indicates no known allergies.  Home Medications  No current outpatient prescriptions on file.  BP 189/119  Pulse 125  Temp(Src) 98.7 F (37.1 C) (Oral)  Resp 18  SpO2 98%  LMP 08/29/2012  Breastfeeding? No  Physical Exam  Nursing note and vitals reviewed. Constitutional: She is oriented to person, place, and time. She appears well-developed and well-nourished. No distress.  HENT:  Head: Normocephalic and atraumatic.  Eyes: Conjunctivae and EOM are normal.  Neck: Normal range of motion. Neck supple.  No meningeal signs  Cardiovascular: Normal heart sounds and intact distal pulses.  Exam reveals no gallop and no friction rub.   No murmur heard. Tachycardic on exam  Pulmonary/Chest: Effort normal and breath sounds normal. No respiratory distress. She has no wheezes. She has no rales. She exhibits no tenderness.  Abdominal: Soft. Bowel sounds are normal. She exhibits no distension. There is no tenderness. There is no rebound and no guarding.  Musculoskeletal: Normal range of motion. She exhibits no edema and no tenderness.  5/5 upper extremities.   Neurological: She is alert and oriented to person, place, and time. No cranial nerve deficit.  No focal deficits. Sensation to light touch intact.  Skin: Skin is  warm and dry. She is not diaphoretic. No erythema.  Psychiatric:  anxious    ED Course  Procedures (including critical care time)  Labs Reviewed - No data to display No results found. Dg Chest 2 View  09/09/2012  *RADIOLOGY REPORT*  Clinical Data: Chest pain  CHEST - 2 VIEW  Comparison: 10/31/2005  Findings: Lungs are clear. No pleural effusion or pneumothorax.  The cardiomediastinal contours are within normal limits. The visualized bones and soft tissues are without significant appreciable abnormality.  IMPRESSION: No radiographic evidence of acute cardiopulmonary process.   Original Report Authenticated By: Jearld Lesch, M.D.    Results for orders placed during the hospital encounter of 09/09/12  D-DIMER, QUANTITATIVE      Result Value Range   D-Dimer, Quant 0.35  0.00 - 0.48 ug/mL-FEU  POCT I-STAT, CHEM 8      Result Value Range   Sodium 142  135 - 145 mEq/L   Potassium 3.8  3.5 - 5.1 mEq/L   Chloride 107  96 - 112 mEq/L   BUN 10  6 - 23 mg/dL   Creatinine, Ser 1.61  0.50 - 1.10 mg/dL   Glucose, Bld 97  70 - 99 mg/dL   Calcium, Ion 0.96  0.45 - 1.23 mmol/L   TCO2 24  0 - 100 mmol/L   Hemoglobin 10.9 (*) 12.0 - 15.0 g/dL   HCT 40.9 (*) 81.1 - 91.4 %  POCT I-STAT TROPONIN I      Result Value Range   Troponin i, poc 0.00  0.00 - 0.08 ng/mL   Comment 3             Date: 09/09/2012  Rate: 121  Rhythm: sinus tachycardia  QRS Axis: normal  Intervals: normal  ST/T Wave abnormalities: normal  Conduction Disutrbances: none  Narrative Interpretation: borderline EKG  Old EKG Reviewed: None available   Diagnosis: palpitations, tachycardia   MDM  No hx of cardiac issues. Fam hx includes maternal grandmother with hx of MI. Want to rule out ACS, pulmonary embolism. Will treat tachycardia with metoprolol. Troponin negative x1. D-dimer negative. Labs and imaging unremarkable.  Tacycardia and tachypnea resolved.  At this time there does not appear to be any evidence of an acute emergency medical condition and the patient appears stable for discharge with appropriate outpatient follow up.Diagnosis was discussed with patient who verbalizes understanding and is agreeable to discharge. Pt case discussed with Dr. Radford Pax who agrees with my plan.    Glade Nurse, PA-C 09/10/12 0111

## 2012-09-11 NOTE — ED Provider Notes (Signed)
Medical screening examination/treatment/procedure(s) were conducted as a shared visit with non-physician practitioner(s) and myself.  I personally evaluated the patient during the encounter    Nelia Shi, MD 09/11/12 1513

## 2012-09-12 ENCOUNTER — Ambulatory Visit: Payer: Managed Care, Other (non HMO) | Admitting: Family Medicine

## 2012-09-14 ENCOUNTER — Ambulatory Visit (INDEPENDENT_AMBULATORY_CARE_PROVIDER_SITE_OTHER): Payer: Managed Care, Other (non HMO) | Admitting: Family Medicine

## 2012-09-14 ENCOUNTER — Encounter: Payer: Self-pay | Admitting: Family Medicine

## 2012-09-14 VITALS — BP 122/80 | HR 82 | Temp 97.4°F | Wt 152.0 lb

## 2012-09-14 DIAGNOSIS — R000 Tachycardia, unspecified: Secondary | ICD-10-CM

## 2012-09-14 DIAGNOSIS — D649 Anemia, unspecified: Secondary | ICD-10-CM

## 2012-09-14 NOTE — Progress Notes (Signed)
  Subjective:    Patient ID: Ashley Stewart, female    DOB: 11-18-1985, 27 y.o.   MRN: [id number]  HPI New to establish care.  Past medical history reviewed. No chronic medical problems. Nonsmoker. No alcohol use. No prescription medications. Family history significant for father with diabetes and maternal grandmother with stroke and diabetes   Patient had recent evaluation emergency room for palpitations. She was eating a meal on 09/09/2012 when she felt somewhat " woozy". Denied syncope. Possible palpitations. No chest pain. No focal weakness. Felt very anxious and had brief difficulty swallowing. No slurred speech.  Taken by EMS to emergency department. Initial blood pressure 189/119. Tachycardia with heart rate around 120. Workup including chest x-ray, EKG, d-dimer, chemistries, troponins normal-with exception of hemoglobin 10.9 She was given one metoprolol and blood pressure and pulse reduced She was discharged home with no definitive diagnosis but felt to be anxiety related. No episodes since then.  No history of exercise related syncope. Very fit and exercises regularly. No history of panic disorder. No recent appetite or weight changes.  Past Medical History  Diagnosis Date  . Abnormal Pap smear    Past Surgical History  Procedure Laterality Date  . Colposcopy      reports that she has never smoked. She does not have any smokeless tobacco history on file. She reports that she does not drink alcohol or use illicit drugs. family history includes Diabetes in her father and maternal grandmother and Stroke in her maternal grandmother. No Known Allergies    Review of Systems  Constitutional: Negative for fever, chills, appetite change and unexpected weight change.  Respiratory: Negative for cough, chest tightness and shortness of breath.   Cardiovascular: Negative for palpitations and leg swelling.  Gastrointestinal: Negative for abdominal pain.  Genitourinary: Negative  for dysuria.  Neurological: Negative for syncope and headaches.       Objective:   Physical Exam  Constitutional: She is oriented to person, place, and time. She appears well-developed and well-nourished.  HENT:  Mouth/Throat: Oropharynx is clear and moist.  Neck: Neck supple. No thyromegaly present.  Cardiovascular: Normal rate and regular rhythm.  Exam reveals no gallop.   No murmur heard. Pulmonary/Chest: Effort normal and breath sounds normal. No respiratory distress. She has no wheezes.  Musculoskeletal: She exhibits no edema.  Lymphadenopathy:    She has no cervical adenopathy.  Neurological: She is alert and oriented to person, place, and time. No cranial nerve deficit.  Psychiatric: She has a normal mood and affect. Her behavior is normal.          Assessment & Plan:  Recent episode above with multiple nonspecific symptoms and on  Initial presentation to ER tachycardia and elevated blood pressure. Question panic attack. Doubt PSVT. Episode was transient. We discussed options. We discussed further workup such as Holter monitor and possible echocardiogram if she has any recurrences. If she is having repeat episodes consider possibility of panic attack. Duration of symptoms over 2 hours not classic for panic. Continue to monitor blood pressure closely. Blood pressure today acceptable and no indication for antihypertensive at this point.

## 2012-09-14 NOTE — Patient Instructions (Addendum)
Follow up promptly for any recurrent tachycardia, dizziness, or any new symptoms Continue to monitor blood pressure and be in touch if consistently > 140/90.

## 2013-01-25 ENCOUNTER — Other Ambulatory Visit: Payer: Self-pay | Admitting: Physician Assistant

## 2013-01-25 DIAGNOSIS — N63 Unspecified lump in unspecified breast: Secondary | ICD-10-CM

## 2013-01-26 ENCOUNTER — Ambulatory Visit
Admission: RE | Admit: 2013-01-26 | Discharge: 2013-01-26 | Disposition: A | Discharge status: Home / Self Care | Payer: Managed Care, Other (non HMO) | Source: Ambulatory Visit | Attending: Physician Assistant | Admitting: Physician Assistant

## 2013-01-26 ENCOUNTER — Other Ambulatory Visit: Payer: Self-pay | Admitting: Physician Assistant

## 2013-01-26 DIAGNOSIS — N63 Unspecified lump in unspecified breast: Secondary | ICD-10-CM

## 2013-02-02 ENCOUNTER — Other Ambulatory Visit: Payer: Managed Care, Other (non HMO)

## 2013-07-11 ENCOUNTER — Other Ambulatory Visit: Payer: Self-pay | Admitting: Physician Assistant

## 2013-07-11 DIAGNOSIS — N63 Unspecified lump in unspecified breast: Secondary | ICD-10-CM

## 2013-07-17 ENCOUNTER — Ambulatory Visit
Admission: RE | Admit: 2013-07-17 | Discharge: 2013-07-17 | Disposition: A | Discharge status: Home / Self Care | Payer: Managed Care, Other (non HMO) | Source: Ambulatory Visit | Attending: Physician Assistant | Admitting: Physician Assistant

## 2013-07-17 DIAGNOSIS — N63 Unspecified lump in unspecified breast: Secondary | ICD-10-CM

## 2014-05-14 ENCOUNTER — Encounter: Payer: Self-pay | Admitting: Family Medicine

## 2014-06-29 ENCOUNTER — Encounter (HOSPITAL_COMMUNITY): Payer: Self-pay | Admitting: *Deleted

## 2014-06-29 ENCOUNTER — Emergency Department (INDEPENDENT_AMBULATORY_CARE_PROVIDER_SITE_OTHER)
Admission: EM | Admit: 2014-06-29 | Discharge: 2014-06-29 | Disposition: A | Discharge status: Home / Self Care | Payer: BC Managed Care – PPO | Source: Home / Self Care

## 2014-06-29 DIAGNOSIS — T148 Other injury of unspecified body region: Secondary | ICD-10-CM

## 2014-06-29 DIAGNOSIS — T148XXA Other injury of unspecified body region, initial encounter: Secondary | ICD-10-CM
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
## 2014-10-22 ENCOUNTER — Other Ambulatory Visit: Payer: Self-pay | Admitting: Physician Assistant

## 2014-10-22 DIAGNOSIS — N6320 Unspecified lump in the left breast, unspecified quadrant: Secondary | ICD-10-CM

## 2014-10-22 DIAGNOSIS — N6310 Unspecified lump in the right breast, unspecified quadrant: Secondary | ICD-10-CM

## 2014-10-25 ENCOUNTER — Other Ambulatory Visit: Payer: BC Managed Care – PPO

## 2014-10-26 ENCOUNTER — Ambulatory Visit
Admission: RE | Admit: 2014-10-26 | Discharge: 2014-10-26 | Disposition: A | Discharge status: Home / Self Care | Payer: BC Managed Care – PPO | Source: Ambulatory Visit | Attending: Physician Assistant | Admitting: Physician Assistant

## 2014-10-26 DIAGNOSIS — N6320 Unspecified lump in the left breast, unspecified quadrant: Secondary | ICD-10-CM

## 2014-10-26 DIAGNOSIS — N6310 Unspecified lump in the right breast, unspecified quadrant: Secondary | ICD-10-CM

## 2015-05-24 ENCOUNTER — Other Ambulatory Visit (HOSPITAL_COMMUNITY)
Admission: RE | Admit: 2015-05-24 | Discharge: 2015-05-24 | Disposition: A | Discharge status: Home / Self Care | Payer: BC Managed Care – PPO | Source: Ambulatory Visit | Attending: Family Medicine | Admitting: Family Medicine

## 2015-05-24 ENCOUNTER — Other Ambulatory Visit: Payer: Self-pay | Admitting: Physician Assistant

## 2015-05-24 DIAGNOSIS — Z124 Encounter for screening for malignant neoplasm of cervix: Secondary | ICD-10-CM | POA: Insufficient documentation

## 2015-05-29 LAB — CYTOLOGY - PAP

## 2017-04-02 ENCOUNTER — Other Ambulatory Visit: Payer: Self-pay | Admitting: Obstetrics and Gynecology

## 2017-04-02 ENCOUNTER — Other Ambulatory Visit (HOSPITAL_COMMUNITY)
Admission: RE | Admit: 2017-04-02 | Discharge: 2017-04-02 | Disposition: A | Discharge status: Home / Self Care | Payer: BC Managed Care – PPO | Source: Ambulatory Visit | Attending: Obstetrics and Gynecology | Admitting: Obstetrics and Gynecology

## 2017-04-02 DIAGNOSIS — Z124 Encounter for screening for malignant neoplasm of cervix: Secondary | ICD-10-CM | POA: Insufficient documentation

## 2017-04-06 LAB — CYTOLOGY - PAP
Diagnosis: NEGATIVE
HPV: NOT DETECTED

## 2018-07-01 ENCOUNTER — Emergency Department (HOSPITAL_COMMUNITY): Payer: BC Managed Care – PPO

## 2018-07-01 ENCOUNTER — Emergency Department (HOSPITAL_COMMUNITY)
Admission: EM | Admit: 2018-07-01 | Discharge: 2018-07-01 | Disposition: A | Discharge status: Home / Self Care | Payer: BC Managed Care – PPO | Attending: Emergency Medicine | Admitting: Emergency Medicine

## 2018-07-01 ENCOUNTER — Encounter (HOSPITAL_COMMUNITY): Payer: Self-pay

## 2018-07-01 ENCOUNTER — Other Ambulatory Visit: Payer: Self-pay

## 2018-07-01 DIAGNOSIS — R079 Chest pain, unspecified: Secondary | ICD-10-CM | POA: Insufficient documentation

## 2018-07-01 DIAGNOSIS — R05 Cough: Secondary | ICD-10-CM | POA: Insufficient documentation

## 2018-07-01 LAB — CBC
HCT: 29.8 % — ABNORMAL LOW (ref 36.0–46.0)
Hemoglobin: 8 g/dL — ABNORMAL LOW (ref 12.0–15.0)
MCH: 17.3 pg — ABNORMAL LOW (ref 26.0–34.0)
MCHC: 26.8 g/dL — AB (ref 30.0–36.0)
MCV: 64.4 fL — AB (ref 80.0–100.0)
NRBC: 0 % (ref 0.0–0.2)
PLATELETS: 337 10*3/uL (ref 150–400)
RBC: 4.63 MIL/uL (ref 3.87–5.11)
RDW: 23.7 % — AB (ref 11.5–15.5)
WBC: 6.2 10*3/uL (ref 4.0–10.5)

## 2018-07-01 LAB — BASIC METABOLIC PANEL
Anion gap: 10 (ref 5–15)
BUN: 7 mg/dL (ref 6–20)
CALCIUM: 8.9 mg/dL (ref 8.9–10.3)
CO2: 23 mmol/L (ref 22–32)
CREATININE: 0.73 mg/dL (ref 0.44–1.00)
Chloride: 103 mmol/L (ref 98–111)
GFR calc non Af Amer: >60 mL/min (ref >60)
Glucose, Bld: 113 mg/dL — ABNORMAL HIGH (ref 70–99)
Potassium: 3.2 mmol/L — ABNORMAL LOW (ref 3.5–5.1)
SODIUM: 136 mmol/L (ref 135–145)

## 2018-07-01 LAB — I-STAT TROPONIN, ED
Troponin i, poc: 0 ng/mL (ref 0.00–0.08)
Troponin i, poc: 0 ng/mL (ref 0.00–0.08)

## 2018-07-01 LAB — I-STAT BETA HCG BLOOD, ED (MC, WL, AP ONLY)

## 2018-07-01 MED ORDER — POTASSIUM CHLORIDE CRYS ER 20 MEQ PO TBCR
40.0000 meq | EXTENDED_RELEASE_TABLET | Freq: Once | ORAL | Status: AC
  Administered 2018-07-01: 40 meq via ORAL
  Filled 2018-07-01: qty 2

## 2018-07-01 NOTE — ED Triage Notes (Signed)
t from home with chest pain on and off for the last day.  Said had few episodes of sharp chest pain to the center of her chest.  No nausea or vomiting.  No chest pain currently in triage.

## 2018-07-01 NOTE — ED Provider Notes (Signed)
[facility] EMERGENCY DEPARTMENT Provider Note   CSN: [id number] Arrival date & time: 07/01/18  0104     History   Chief Complaint Chief Complaint  Patient presents with  . Chest Pain    HPI Ashley Stewart is a 32 y.o. female.  Patient presents to the emergency department with a chief complaint of chest pain.  She states that she had a sharp stabbing chest pain yesterday at around 6 PM.  This was brief and fleeting.  She states that then again around midnight she had some chest pressure, which she states feels similar to when she has a panic attack.  However, she states that she was not having a panic attack.  She denies shortness of breath, nausea, or vomiting.  Denies any treatments prior to arrival.  Denies any recent long travel or history of PE or DVT.  Reports that she has had some cough and chest congestion for the past week or so.  Denies any associated fever.  The history is provided by the patient. No language interpreter was used.    Past Medical History:  Diagnosis Date  . Abnormal Pap smear     Patient Active Problem List   Diagnosis Date Noted  . Anemia 09/14/2012    Past Surgical History:  Procedure Laterality Date  . COLPOSCOPY       OB History    Gravida  2   Para  2   Term  2   Preterm      AB      Living  2     SAB      TAB      Ectopic      Multiple      Live Births  1            Home Medications    Prior to Admission medications   Not on File    Family History Family History  Problem Relation Age of Onset  . Diabetes Maternal Grandmother   . Stroke Maternal Grandmother   . Diabetes Father        type 2    Social History Social History   Tobacco Use  . Smoking status: Never Smoker  . Smokeless tobacco: Never Used  Substance Use Topics  . Alcohol use: No  . Drug use: No     Allergies   Patient has no known allergies.   Review of Systems Review of Systems  All other systems  reviewed and are negative.    Physical Exam Updated Vital Signs BP 101/88   Pulse 63   Temp 98.1 F (36.7 C) (Oral)   Resp 19   SpO2 100%   Physical Exam Vitals signs and nursing note reviewed.  Constitutional:      Appearance: She is well-developed.  HENT:     Head: Normocephalic and atraumatic.  Eyes:     Conjunctiva/sclera: Conjunctivae normal.     Pupils: Pupils are equal, round, and reactive to light.  Neck:     Musculoskeletal: Normal range of motion and neck supple.  Cardiovascular:     Rate and Rhythm: Normal rate and regular rhythm.     Heart sounds: No murmur. No friction rub. No gallop.   Pulmonary:     Effort: Pulmonary effort is normal. No respiratory distress.     Breath sounds: Normal breath sounds. No wheezing or rales.  Chest:     Chest wall: No tenderness.  Abdominal:     General:  Bowel sounds are normal. There is no distension.     Palpations: Abdomen is soft. There is no mass.     Tenderness: There is no abdominal tenderness. There is no guarding or rebound.  Musculoskeletal: Normal range of motion.        General: No tenderness.  Skin:    General: Skin is warm and dry.  Neurological:     Mental Status: She is alert and oriented to person, place, and time.  Psychiatric:        Behavior: Behavior normal.        Thought Content: Thought content normal.        Judgment: Judgment normal.      ED Treatments / Results  Labs (all labs ordered are listed, but only abnormal results are displayed) Labs Reviewed  BASIC METABOLIC PANEL - Abnormal; Notable for the following components:      Result Value   Potassium 3.2 (*)    Glucose, Bld 113 (*)    All other components within normal limits  CBC - Abnormal; Notable for the following components:   Hemoglobin 8.0 (*)    HCT 29.8 (*)    MCV 64.4 (*)    MCH 17.3 (*)    MCHC 26.8 (*)    RDW 23.7 (*)    All other components within normal limits  I-STAT TROPONIN, ED  I-STAT BETA HCG BLOOD, ED ([facility],  [facility], [facility] ONLY)    EKG EKG Interpretation  Date/Time:  Friday July 01 2018 01:11:17 EST Ventricular Rate:  73 PR Interval:  180 QRS Duration: 82 QT Interval:  380 QTC Calculation: 418 R Axis:   65 Text Interpretation:  Normal sinus rhythm Nonspecific T wave abnormality Abnormal ECG When compared with ECG of 09/09/2012, No significant change was found Confirmed by Dione BoozeGlick, David ([id number]) on 07/01/2018 2:03:41 AM   Radiology Dg Chest 2 View  Result Date: 07/01/2018 CLINICAL DATA:  Chest pain. EXAM: CHEST - 2 VIEW COMPARISON:  Chest x-ray dated September 09, 2012. FINDINGS: The heart size and mediastinal contours are within normal limits. Both lungs are clear. The visualized skeletal structures are unremarkable. IMPRESSION: No active cardiopulmonary disease. Electronically Signed   By: Obie DredgeWilliam T Derry M.D.   On: 07/01/2018 02:20    Procedures Procedures (including critical care time)  Medications Ordered in ED Medications  potassium chloride SA (K-DUR,KLOR-CON) CR tablet 40 mEq (has no administration in time range)     Initial Impression / Assessment and Plan / ED Course  I have reviewed the triage vital signs and the nursing notes.  Pertinent labs & imaging results that were available during my care of the patient were reviewed by me and considered in my medical decision making (see chart for details).     Patient with chest pain.  She experienced one sharp stabbing episode of chest pain yesterday at 6 PM which was brief.  She then experienced some chest pain which she says felt similar to when she has panic attack at around midnight.  Denies any positional changes with regard to her chest pain.  Chest x-ray is negative.  EKG is unchanged from priors.  She does have anemia, hemoglobin is 8.0.  Potassium is slightly low, will replace this in the emergency department.  Will check repeat troponin at 330 since pain came back around midnight.  Repeat troponin is negative. Will  discharge with PCP follow-up.  Return precautions discussed.  Patient understands and agrees with the plan.  Final Clinical Impressions(s) / ED  Diagnoses   Final diagnoses:  Chest pain, unspecified type    ED Discharge Orders    None       Roxy HorsemanBrowning, Jillana Selph, PA-C 07/01/18 0354    Dione BoozeGlick, David, MD 07/01/18 [phone]

## 2018-07-01 NOTE — ED Notes (Signed)
PT states understanding of care given, follow up care. PT ambulated from ED to car with a steady gait.  

## 2018-08-22 ENCOUNTER — Encounter: Payer: Self-pay | Admitting: Hematology

## 2018-08-22 ENCOUNTER — Telehealth: Payer: Self-pay | Admitting: Hematology

## 2018-08-22 NOTE — Telephone Encounter (Signed)
A new patient appt has been scheduled for the pt to see Dr. Candise Che on 2/26 at 1pm. I cld and spoke to [facility] at Dr. Ginnie Smart office and provided the appt date and time. Letter mailed.

## 2018-09-07 ENCOUNTER — Inpatient Hospital Stay: Payer: BC Managed Care – PPO | Attending: Hematology | Admitting: Hematology

## 2018-09-20 ENCOUNTER — Telehealth: Payer: Self-pay | Admitting: Hematology

## 2018-09-20 NOTE — Telephone Encounter (Signed)
lft vm to reschedule hem appt °

## 2019-10-10 ENCOUNTER — Other Ambulatory Visit: Payer: Self-pay | Admitting: Family

## 2019-10-10 DIAGNOSIS — E559 Vitamin D deficiency, unspecified: Secondary | ICD-10-CM

## 2019-10-10 DIAGNOSIS — D500 Iron deficiency anemia secondary to blood loss (chronic): Secondary | ICD-10-CM

## 2019-10-10 DIAGNOSIS — D510 Vitamin B12 deficiency anemia due to intrinsic factor deficiency: Secondary | ICD-10-CM

## 2019-10-11 ENCOUNTER — Other Ambulatory Visit: Payer: Self-pay

## 2019-10-11 ENCOUNTER — Inpatient Hospital Stay: Payer: BC Managed Care – PPO | Attending: Family

## 2019-10-11 ENCOUNTER — Inpatient Hospital Stay (HOSPITAL_BASED_OUTPATIENT_CLINIC_OR_DEPARTMENT_OTHER): Payer: BC Managed Care – PPO | Admitting: Family

## 2019-10-11 ENCOUNTER — Encounter: Payer: Self-pay | Admitting: Family

## 2019-10-11 VITALS — BP 137/90 | HR 79 | Temp 97.1°F | Resp 18 | Ht 69.0 in | Wt 168.0 lb

## 2019-10-11 DIAGNOSIS — R202 Paresthesia of skin: Secondary | ICD-10-CM

## 2019-10-11 DIAGNOSIS — D573 Sickle-cell trait: Secondary | ICD-10-CM | POA: Diagnosis not present

## 2019-10-11 DIAGNOSIS — Z833 Family history of diabetes mellitus: Secondary | ICD-10-CM

## 2019-10-11 DIAGNOSIS — D568 Other thalassemias: Secondary | ICD-10-CM

## 2019-10-11 DIAGNOSIS — R0602 Shortness of breath: Secondary | ICD-10-CM | POA: Insufficient documentation

## 2019-10-11 DIAGNOSIS — R200 Anesthesia of skin: Secondary | ICD-10-CM | POA: Insufficient documentation

## 2019-10-11 DIAGNOSIS — R002 Palpitations: Secondary | ICD-10-CM

## 2019-10-11 DIAGNOSIS — D500 Iron deficiency anemia secondary to blood loss (chronic): Secondary | ICD-10-CM

## 2019-10-11 DIAGNOSIS — E559 Vitamin D deficiency, unspecified: Secondary | ICD-10-CM

## 2019-10-11 DIAGNOSIS — D510 Vitamin B12 deficiency anemia due to intrinsic factor deficiency: Secondary | ICD-10-CM

## 2019-10-11 DIAGNOSIS — N920 Excessive and frequent menstruation with regular cycle: Secondary | ICD-10-CM

## 2019-10-11 DIAGNOSIS — Z79899 Other long term (current) drug therapy: Secondary | ICD-10-CM

## 2019-10-11 LAB — CBC WITH DIFFERENTIAL (CANCER CENTER ONLY)
Abs Immature Granulocytes: 0.02 10*3/uL (ref 0.00–0.07)
Basophils Absolute: 0 10*3/uL (ref 0.0–0.1)
Basophils Relative: 1 %
Eosinophils Absolute: 0.1 10*3/uL (ref 0.0–0.5)
Eosinophils Relative: 3 %
HCT: 38.5 % (ref 36.0–46.0)
Hemoglobin: 11.7 g/dL — ABNORMAL LOW (ref 12.0–15.0)
Immature Granulocytes: 1 %
Lymphocytes Relative: 58 %
Lymphs Abs: 2.4 10*3/uL (ref 0.7–4.0)
MCH: 23.8 pg — ABNORMAL LOW (ref 26.0–34.0)
MCHC: 30.4 g/dL (ref 30.0–36.0)
MCV: 78.3 fL — ABNORMAL LOW (ref 80.0–100.0)
Monocytes Absolute: 0.2 10*3/uL (ref 0.1–1.0)
Monocytes Relative: 6 %
Neutro Abs: 1.3 10*3/uL — ABNORMAL LOW (ref 1.7–7.7)
Neutrophils Relative %: 31 %
Platelet Count: 249 10*3/uL (ref 150–400)
RBC: 4.92 MIL/uL (ref 3.87–5.11)
RDW: 15.5 % (ref 11.5–15.5)
WBC Count: 4.1 10*3/uL (ref 4.0–10.5)
nRBC: 0 % (ref 0.0–0.2)

## 2019-10-11 LAB — FOLATE: Folate: 14 ng/mL

## 2019-10-11 LAB — CMP (CANCER CENTER ONLY)
ALT: 12 U/L (ref 0–44)
AST: 20 U/L (ref 15–41)
Albumin: 4.4 g/dL (ref 3.5–5.0)
Alkaline Phosphatase: 73 U/L (ref 38–126)
Anion gap: 6 (ref 5–15)
BUN: 10 mg/dL (ref 6–20)
CO2: 28 mmol/L (ref 22–32)
Calcium: 9.5 mg/dL (ref 8.9–10.3)
Chloride: 105 mmol/L (ref 98–111)
Creatinine: 0.75 mg/dL (ref 0.44–1.00)
GFR, Est AFR Am: >60 mL/min (ref >60)
GFR, Estimated: >60 mL/min (ref >60)
Glucose, Bld: 105 mg/dL — ABNORMAL HIGH (ref 70–99)
Potassium: 3.4 mmol/L — ABNORMAL LOW (ref 3.5–5.1)
Sodium: 139 mmol/L (ref 135–145)
Total Bilirubin: 0.4 mg/dL (ref 0.3–1.2)
Total Protein: 8 g/dL (ref 6.5–8.1)

## 2019-10-11 LAB — VITAMIN D 25 HYDROXY (VIT D DEFICIENCY, FRACTURES): Vit D, 25-Hydroxy: 38.24 ng/mL (ref 30–100)

## 2019-10-11 LAB — SAVE SMEAR(SSMR), FOR PROVIDER SLIDE REVIEW

## 2019-10-11 LAB — IRON AND TIBC
Iron: 36 ug/dL — ABNORMAL LOW (ref 41–142)
Saturation Ratios: 10 % — ABNORMAL LOW (ref 21–57)
TIBC: 366 ug/dL (ref 236–444)
UIBC: 331 ug/dL (ref 120–384)

## 2019-10-11 LAB — RETICULOCYTES
Immature Retic Fract: 3.4 % (ref 2.3–15.9)
RBC.: 4.86 MIL/uL (ref 3.87–5.11)
Retic Count, Absolute: 23.7 10*3/uL (ref 19.0–186.0)
Retic Ct Pct: 0.5 % (ref 0.4–3.1)

## 2019-10-11 LAB — FERRITIN: Ferritin: 12 ng/mL (ref 11–307)

## 2019-10-11 LAB — LACTATE DEHYDROGENASE: LDH: 180 U/L (ref 98–192)

## 2019-10-11 LAB — VITAMIN B12: Vitamin B-12: 623 pg/mL (ref 180–914)

## 2019-10-11 MED ORDER — FOLIC ACID 1 MG PO TABS: 1.0000 mg | ORAL_TABLET | Freq: Every day | ORAL | 3 refills | Status: DC

## 2019-10-11 NOTE — Progress Notes (Addendum)
Hematology/Oncology Consultation   Name: Ashley Stewart      MRN: [id number]    Location: Room/bed info not found  Date: 10/11/2019 Time:10:30 AM   REFERRING PHYSICIAN: Geryl Rankins, MD  REASON FOR CONSULT: Iron deficiency anemia    DIAGNOSIS:  Iron deficiency anemia secondary to heavy cycles Sickle cell trait  HISTORY OF PRESENT ILLNESS: Ashley Stewart is a very pleasant 34 yo African American female with history of iron deficiency anemia. She received IV iron back in January 2020.  Her last available iron studies were in September 2020, iron saturation 10% and ferritin was 19.  Hgb is 11.7, MCV 78.  She has heavy cycles with clots. Her cycle is regular lasting 5-7 days with the first 2-3 days being the heaviest.  She has not noted any other blood loss. No petechiae. She does bruise easily at times.  She does have the sickle cell trait.  She is currently taking fusion plus as well as a multivitamin daily.  She is symptomatic with mild SOB with activity, occasional palpitations and positional numbness and tingling in her hands.  She used to eat flour (pica) but stopped a year or so ago.  No family history of anemia that she knows of.  She has two sweet little girls and no history of miscarriage.  She had her wisdom teeth removed in the past without and complications. No other surgical history.  No personal history of diabetes or thyroid disease.  No fever, chills, n/v, cough, rash, dizziness, chest pain, abdominal pain or changes in bowel or bladder habits.  She has maintained a good appetite and is staying well hydrated. Her weight is stable.  She does not smoke, drink alcoholic beverages or use recreational drugs.  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   ROS: All other 10 point review of systems is negative.   PAST MEDICAL HISTORY:   Past Medical History:  Diagnosis Date  . Abnormal Pap smear     ALLERGIES: No Known Allergies    MEDICATIONS:  Current  Outpatient Medications on File Prior to Visit  Medication Sig Dispense Refill  . Ascorbic Acid (VITAMIN C) 500 MG CAPS Take 1 capsule by mouth daily.    . Iron-FA-B Cmp-C-Biot-Probiotic (FUSION PLUS) CAPS Take 1 capsule by mouth daily.    . Multiple Vitamin (MULTIVITAMIN ADULT) TABS Take 1 tablet by mouth daily.    . Vitamin D, Ergocalciferol, (DRISDOL) 1.25 MG (50000 UNIT) CAPS capsule Take 50,000 Units by mouth once a week.     No current facility-administered medications on file prior to visit.     PAST SURGICAL HISTORY Past Surgical History:  Procedure Laterality Date  . COLPOSCOPY      FAMILY HISTORY: Family History  Problem Relation Age of Onset  . Diabetes Maternal Grandmother   . Stroke Maternal Grandmother   . Diabetes Father        type 2    SOCIAL HISTORY:  reports that she has never smoked. She has never used smokeless tobacco. She reports that she does not drink alcohol or use drugs.  PERFORMANCE STATUS: The patient's performance status is 1 - Symptomatic but completely ambulatory  PHYSICAL EXAM: Most Recent Vital Signs: Blood pressure 137/90, pulse 79, temperature (!) 97.1 F (36.2 C), temperature source Temporal, resp. rate 18, height 5\' 9"  (1.753 m), weight 168 lb 0.6 oz (76.2 kg), SpO2 100 %. BP 137/90 (BP Location: Left Arm, Patient Position: Sitting)   Pulse 79   Temp )  97.1 F (36.2 C) (Temporal)   Resp 18   Ht 5\' 9"  (1.753 m)   Wt 168 lb 0.6 oz (76.2 kg)   SpO2 100%   BMI 24.82 kg/m   General Appearance:    Alert, cooperative, no distress, appears stated age  Head:    Normocephalic, without obvious abnormality, atraumatic  Eyes:    PERRL, conjunctiva/corneas clear, EOM's intact, fundi    benign, both eyes        Throat:   Lips, mucosa, and tongue normal; teeth and gums normal  Neck:   Supple, symmetrical, trachea midline, no adenopathy;    thyroid:  no enlargement/tenderness/nodules; no carotid   bruit or JVD  Back:     Symmetric, no  curvature, ROM normal, no CVA tenderness  Lungs:     Clear to auscultation bilaterally, respirations unlabored  Chest Wall:    No tenderness or deformity   Heart:    Regular rate and rhythm, S1 and S2 normal, no murmur, rub   or gallop     Abdomen:     Soft, non-tender, bowel sounds active all four quadrants,    no masses, no organomegaly        Extremities:   Extremities normal, atraumatic, no cyanosis or edema  Pulses:   2+ and symmetric all extremities  Skin:   Skin color, texture, turgor normal, no rashes or lesions  Lymph nodes:   Cervical, supraclavicular, and axillary nodes normal  Neurologic:   CNII-XII intact, normal strength, sensation and reflexes    throughout    LABORATORY DATA:  Results for orders placed or performed in visit on 10/11/19 (from the past 48 hour(s))  CMP ([place] only)     Status: Abnormal   Collection Time: 10/11/19  8:59 AM  Result Value Ref Range   Sodium 139 135 - 145 mmol/L   Potassium 3.4 (L) 3.5 - 5.1 mmol/L   Chloride 105 98 - 111 mmol/L   CO2 28 22 - 32 mmol/L   Glucose, Bld 105 (H) 70 - 99 mg/dL    Comment: Glucose reference range applies only to samples taken after fasting for at least 8 hours.   BUN 10 6 - 20 mg/dL   Creatinine 0.75 0.44 - 1.00 mg/dL   Calcium 9.5 8.9 - 10.3 mg/dL   Total Protein 8.0 6.5 - 8.1 g/dL   Albumin 4.4 3.5 - 5.0 g/dL   AST 20 15 - 41 U/L   ALT 12 0 - 44 U/L   Alkaline Phosphatase 73 38 - 126 U/L   Total Bilirubin 0.4 0.3 - 1.2 mg/dL   GFR, Est Non Af Am >60 >60 mL/min   GFR, Est AFR Am >60 >60 mL/min   Anion gap 6 5 - 15    Comment: Performed at [facility], [place] [address], [place], [place] [address]  Save Smear [facility])     Status: None   Collection Time: 10/11/19  8:59 AM  Result Value Ref Range   Smear Review SMEAR STAINED AND AVAILABLE FOR REVIEW     Comment: Performed at [facility] at [facility], [address] [place], [place] [address]   CBC with Differential ([facility] Center Only)     Status: Abnormal   Collection Time: 10/11/19  8:59 AM  Result Value Ref Range   WBC Count 4.1 4.0 - 10.5 K/uL   RBC 4.92 3.87 - 5.11 MIL/uL   Hemoglobin 11.7 (L) 12.0 -  15.0 g/dL   HCT 44.8 18.5 - 63.1 %   MCV 78.3 (L) 80.0 - 100.0 fL   MCH 23.8 (L) 26.0 - 34.0 pg   MCHC 30.4 30.0 - 36.0 g/dL   RDW 49.7 02.6 - 37.8 %   Platelet Count 249 150 - 400 K/uL   nRBC 0.0 0.0 - 0.2 %   Neutrophils Relative % 31 %   Neutro Abs 1.3 (L) 1.7 - 7.7 K/uL   Lymphocytes Relative 58 %   Lymphs Abs 2.4 0.7 - 4.0 K/uL   Monocytes Relative 6 %   Monocytes Absolute 0.2 0.1 - 1.0 K/uL   Eosinophils Relative 3 %   Eosinophils Absolute 0.1 0.0 - 0.5 K/uL   Basophils Relative 1 %   Basophils Absolute 0.0 0.0 - 0.1 K/uL   Immature Granulocytes 1 %   Abs Immature Granulocytes 0.02 0.00 - 0.07 K/uL    Comment: Performed at [facility] at [facility], [address], [place], [place] [address]  Reticulocytes     Status: None   Collection Time: 10/11/19  9:00 AM  Result Value Ref Range   Retic Ct Pct 0.5 0.4 - 3.1 %   RBC. 4.86 3.87 - 5.11 MIL/uL   Retic Count, Absolute 23.7 19.0 - 186.0 K/uL   Immature Retic Fract 3.4 2.3 - 15.9 %    Comment: Performed at [facility] at [facility], [address], [place], [place] [address]      RADIOGRAPHY: No results found.     PATHOLOGY: None  ASSESSMENT/PLAN: Ashley Stewart is a very pleasant 34 yo African American female with history of iron deficiency anemia secondary to heavy cycles and the sickle cell trait.   We did an extensive lab work up today for anemia and results are pending.  She will continue her Fusion plus supplement daily and we will have her add Folic acid 1 mg PO daily as well.  If everything with her lab work is stable we can let her go from our office. If iron studies are still quite low we will consider IV iron infusion.   All questions were  answered and she is in agreement. She can contact our office with any questions or concerns. We can certainly see her again if needed.   She was discussed with and also seen by Dr. Myna Hidalgo and he is in agreement with the aforementioned.   Emeline Gins, NP    Addendum: I saw and examined Ashley Stewart with Maralyn Sago.  I agree with the above assessment.  I looked at her blood smear.  She really did not have much in the way of anisocytosis or poikilocytosis.  I saw no target cells.  She had no nucleated red blood cells.  There were no schistocytes or spherocytes.  White blood cells appeared normal in morphology maturation.  She had no hypersegmented polys.  She had no immature myeloid or lymphoid cells.  Platelets were adequate number and size.  She clearly is iron deficient.  Her ferritin is 12 an iron saturation of 10%.  Her hemoglobin is coming up slowly.  She is on oral iron.  I think we can probably just keep her on the oral iron.  I agree with the folic acid for the sickle cell trait.  I think this would be reasonable to help with any kind of hemolysis that might happen with the sickle cell trait.  Since her hemoglobin is coming  up with oral iron, I renal think we have to see her back.  I do not see that we have to give her IV iron.  She is very very nice.  It was fun talking to her.  She is very interesting.  We spent about 45 minutes with her today.  We answered all of her questions.  Christin Bach, MD

## 2019-10-12 LAB — ERYTHROPOIETIN: Erythropoietin: 28.1 m[IU]/mL — ABNORMAL HIGH (ref 2.6–18.5)

## 2019-10-13 LAB — HGB SOLUBILITY: Hgb Solubility: POSITIVE — AB

## 2019-10-13 LAB — HGB FRACTIONATION CASCADE
Hgb A2: 3 % (ref 1.8–3.2)
Hgb A: 61.6 % — ABNORMAL LOW (ref 96.4–98.8)
Hgb F: 0 % (ref 0.0–2.0)
Hgb S: 35.4 % — ABNORMAL HIGH

## 2019-10-24 ENCOUNTER — Inpatient Hospital Stay: Payer: BC Managed Care – PPO

## 2019-10-24 ENCOUNTER — Ambulatory Visit: Payer: BC Managed Care – PPO | Admitting: Hematology

## 2019-10-24 LAB — ALPHA-THALASSEMIA GENOTYPR

## 2019-10-25 ENCOUNTER — Telehealth: Payer: Self-pay | Admitting: *Deleted

## 2019-10-25 NOTE — Telephone Encounter (Signed)
As noted below by Emeline Gins, NP, I informed the patient that she has the sickle cell trait, which she is aware of, and alpha thalassemia which is often seen with sickle cell trait, means you are more prone to anemia and small red blood cells which reflects on the lab work. You are already on Folic Acid and that is what we use to treat both of these issues. Continue taking the iron supplement for iron deficiency and we will see you back in three months. She verbalized understanding.

## 2019-10-25 NOTE — Telephone Encounter (Signed)
-----   Message from Verdie Mosher, NP sent at 10/25/2019  1:01 PM EDT ----- She has the sickle cell trait (she already knows this) and alpha thalassemia which is often seen with sickle cell trait and just means she is prone to anemia and making small red blood cells which makes sense with her lab work. She is already on folic acid which is what we use to treat both those issues :)  Continue iron supplement for iron deficiency and we will see her back in 3 months! Thank you!!!!  ----- Message ----- From: Leory Plowman, Lab In [place] Sent: 10/11/2019   9:14 AM EDT To: Verdie Mosher, NP

## 2020-01-22 ENCOUNTER — Other Ambulatory Visit: Payer: Self-pay | Admitting: Family

## 2020-01-22 DIAGNOSIS — D560 Alpha thalassemia: Secondary | ICD-10-CM

## 2020-01-22 DIAGNOSIS — D500 Iron deficiency anemia secondary to blood loss (chronic): Secondary | ICD-10-CM

## 2020-01-22 DIAGNOSIS — D573 Sickle-cell trait: Secondary | ICD-10-CM

## 2020-01-23 ENCOUNTER — Inpatient Hospital Stay: Payer: BC Managed Care – PPO

## 2020-01-23 ENCOUNTER — Inpatient Hospital Stay: Payer: BC Managed Care – PPO | Attending: Family | Admitting: Family

## 2020-03-04 ENCOUNTER — Encounter (HOSPITAL_BASED_OUTPATIENT_CLINIC_OR_DEPARTMENT_OTHER): Payer: Self-pay | Admitting: *Deleted

## 2020-03-04 ENCOUNTER — Emergency Department (HOSPITAL_BASED_OUTPATIENT_CLINIC_OR_DEPARTMENT_OTHER)
Admission: EM | Admit: 2020-03-04 | Discharge: 2020-03-04 | Disposition: A | Discharge status: Home / Self Care | Payer: BC Managed Care – PPO | Attending: Emergency Medicine | Admitting: Emergency Medicine

## 2020-03-04 ENCOUNTER — Other Ambulatory Visit: Payer: Self-pay

## 2020-03-04 DIAGNOSIS — R5383 Other fatigue: Secondary | ICD-10-CM | POA: Insufficient documentation

## 2020-03-04 DIAGNOSIS — Z5321 Procedure and treatment not carried out due to patient leaving prior to being seen by health care provider: Secondary | ICD-10-CM | POA: Insufficient documentation

## 2020-03-04 DIAGNOSIS — R519 Headache, unspecified: Secondary | ICD-10-CM | POA: Insufficient documentation

## 2020-03-04 NOTE — ED Triage Notes (Signed)
Fatigue, headache. States he wants an iron infusion. Informed we are happy to see her but we do not do iron infusions here.

## 2020-03-04 NOTE — ED Notes (Signed)
Pt states she will see her MD tomorrow.

## 2020-03-04 NOTE — ED Notes (Signed)
Pt wants an Iron level. Lab states Iron levels are send out test.

## 2021-09-12 ENCOUNTER — Inpatient Hospital Stay (HOSPITAL_BASED_OUTPATIENT_CLINIC_OR_DEPARTMENT_OTHER): Payer: BC Managed Care – PPO | Admitting: Family

## 2021-09-12 ENCOUNTER — Other Ambulatory Visit: Payer: Self-pay | Admitting: Family

## 2021-09-12 ENCOUNTER — Other Ambulatory Visit: Payer: Self-pay | Admitting: *Deleted

## 2021-09-12 ENCOUNTER — Inpatient Hospital Stay: Payer: BC Managed Care – PPO | Attending: Hematology & Oncology

## 2021-09-12 ENCOUNTER — Encounter: Payer: Self-pay | Admitting: Family

## 2021-09-12 ENCOUNTER — Other Ambulatory Visit: Payer: Self-pay

## 2021-09-12 VITALS — BP 126/85 | HR 73 | Temp 99.1°F | Resp 17 | Ht 69.0 in | Wt 177.0 lb

## 2021-09-12 DIAGNOSIS — D560 Alpha thalassemia: Secondary | ICD-10-CM | POA: Diagnosis not present

## 2021-09-12 DIAGNOSIS — N920 Excessive and frequent menstruation with regular cycle: Secondary | ICD-10-CM | POA: Insufficient documentation

## 2021-09-12 DIAGNOSIS — D573 Sickle-cell trait: Secondary | ICD-10-CM

## 2021-09-12 DIAGNOSIS — D649 Anemia, unspecified: Secondary | ICD-10-CM

## 2021-09-12 DIAGNOSIS — D500 Iron deficiency anemia secondary to blood loss (chronic): Secondary | ICD-10-CM

## 2021-09-12 LAB — CBC WITH DIFFERENTIAL (CANCER CENTER ONLY)
Abs Immature Granulocytes: 0.03 10*3/uL (ref 0.00–0.07)
Basophils Absolute: 0.1 10*3/uL (ref 0.0–0.1)
Basophils Relative: 1 %
Eosinophils Absolute: 0.1 10*3/uL (ref 0.0–0.5)
Eosinophils Relative: 2 %
HCT: 25.1 % — ABNORMAL LOW (ref 36.0–46.0)
Hemoglobin: 7.4 g/dL — ABNORMAL LOW (ref 12.0–15.0)
Immature Granulocytes: 1 %
Lymphocytes Relative: 50 %
Lymphs Abs: 2.2 10*3/uL (ref 0.7–4.0)
MCH: 19.1 pg — ABNORMAL LOW (ref 26.0–34.0)
MCHC: 29.5 g/dL — ABNORMAL LOW (ref 30.0–36.0)
MCV: 64.9 fL — ABNORMAL LOW (ref 80.0–100.0)
Monocytes Absolute: 0.5 10*3/uL (ref 0.1–1.0)
Monocytes Relative: 12 %
Neutro Abs: 1.5 10*3/uL — ABNORMAL LOW (ref 1.7–7.7)
Neutrophils Relative %: 34 %
Platelet Count: 304 10*3/uL (ref 150–400)
RBC: 3.87 MIL/uL (ref 3.87–5.11)
RDW: 19.2 % — ABNORMAL HIGH (ref 11.5–15.5)
WBC Count: 4.3 10*3/uL (ref 4.0–10.5)
nRBC: 0 % (ref 0.0–0.2)

## 2021-09-12 LAB — RETICULOCYTES
Immature Retic Fract: 26.9 % — ABNORMAL HIGH (ref 2.3–15.9)
RBC.: 3.88 MIL/uL (ref 3.87–5.11)
Retic Count, Absolute: 40.4 10*3/uL (ref 19.0–186.0)
Retic Ct Pct: 1 % (ref 0.4–3.1)

## 2021-09-12 LAB — SAMPLE TO BLOOD BANK

## 2021-09-12 LAB — PREPARE RBC (CROSSMATCH)

## 2021-09-12 MED ORDER — FOLIC ACID 1 MG PO TABS: 1.0000 mg | ORAL_TABLET | Freq: Every day | ORAL | 3 refills | Status: DC

## 2021-09-12 NOTE — Progress Notes (Signed)
?Hematology and Oncology Follow Up Visit ? ?Ashley Stewart ?[id number] ?[birth year] 36 y.o. ?09/12/2021 ? ? ?Principle Diagnosis:  ?Iron deficiency anemia  ?Sickle cell trait ?Alpha thalassemia minor ? ?Current Therapy:   ?IV iron as indicated ?Folic acid 1 mg PO daily  ?  ?Interim History:  Ashley Stewart is here today for follow-up. She is symptomatic with fatigue and occasional SOB and palpitations.  ?She notes numbness and tingling in her hands and feet.  ?Her cycles is regular and lasts 7 days total. The first 2-3 days are heavy and she does note small clots.  ?No other blood loss noted. No bruising or petechiae.  ?No fever, chills, n/v, cough, rash, dizziness, chest pain, abdominal pain or changes in bowel or bladder habits.  ?No swelling noted in her extremities.  ?No falls or syncope.  ?She has maintained a good appetite and is staying well hydrated. Weight is stable at 177 lbs.  ? ?ECOG Performance Status: 1 - Symptomatic but completely ambulatory ? ?Medications:  ?Allergies as of 09/12/2021   ?No Known Allergies ?  ? ?  ?Medication List  ?  ? ?  ? Accurate as of September 12, 2021  3:31 PM. If you have any questions, ask your nurse or doctor.  ?  ?  ? ?  ? ?ALPRAZolam 0.25 MG tablet ?Commonly known as: Duanne Moron ?Take 1 tablet by mouth as needed. ?  ?folic acid 1 MG tablet ?Commonly known as: FOLVITE ?Take 1 tablet (1 mg total) by mouth daily. ?  ?Fusion Plus Caps ?Take 1 capsule by mouth daily. Unsure name and dose ?  ?losartan 25 MG tablet ?Commonly known as: COZAAR ?Take 25 mg by mouth daily. ?  ?Multivitamin Adult Tabs ?Take 1 tablet by mouth daily. ?  ?pantoprazole 40 MG tablet ?Commonly known as: PROTONIX ?TAKE 1 TABLET EVERY DAY AS NEEDED ?  ?tranexamic acid 650 MG Tabs tablet ?Commonly known as: LYSTEDA ?Take 1,300 mg by mouth 3 (three) times daily. First two days of period ?  ?Vitamin C 500 MG Caps ?Take 1 capsule by mouth daily. ?  ?Vitamin D (Ergocalciferol) 1.25 MG (50000 UNIT) Caps capsule ?Commonly known as:  DRISDOL ?Take 50,000 Units by mouth once a week. ?  ? ?  ? ? ?Allergies: No Known Allergies ? ?Past Medical History, Surgical history, Social history, and Family History were reviewed and updated. ? ?Review of Systems: ?All other 10 point review of systems is negative.  ? ?Physical Exam: ? height is 5\' 9"  (1.753 m) and weight is 177 lb (80.3 kg). Her oral temperature is 99.1 ?F (37.3 ?C). Her blood pressure is 126/85 and her pulse is 73. Her respiration is 17 and oxygen saturation is 100%.  ? ?Wt Readings from Last 3 Encounters:  ?09/12/21 177 lb (80.3 kg)  ?03/04/20 170 lb (77.1 kg)  ?10/11/19 168 lb 0.6 oz (76.2 kg)  ? ? ?Ocular: Sclerae unicteric, pupils equal, round and reactive to light ?Ear-nose-throat: Oropharynx clear, dentition fair ?Lymphatic: No cervical or supraclavicular adenopathy ?Lungs no rales or rhonchi, good excursion bilaterally ?Heart regular rate and rhythm, no murmur appreciated ?Abd soft, nontender, positive bowel sounds ?MSK no focal spinal tenderness, no joint edema ?Neuro: non-focal, well-oriented, appropriate affect ?Breasts: Deferred  ? ?Lab Results  ?Component Value Date  ? WBC 4.3 09/12/2021  ? HGB 7.4 (L) 09/12/2021  ? HCT 25.1 (L) 09/12/2021  ? MCV 64.9 (L) 09/12/2021  ? PLT 304 09/12/2021  ? ?Lab Results  ?Component Value Date  ?  FERRITIN 12 10/11/2019  ? IRON 36 (L) 10/11/2019  ? TIBC 366 10/11/2019  ? UIBC 331 10/11/2019  ? IRONPCTSAT 10 (L) 10/11/2019  ? ?Lab Results  ?Component Value Date  ? RETICCTPCT 1.0 09/12/2021  ? RBC 3.87 09/12/2021  ? RBC 3.88 09/12/2021  ? ?No results found for: KPAFRELGTCHN, LAMBDASER, KAPLAMBRATIO ?No results found for: IGGSERUM, IGA, IGMSERUM ?No results found for: TOTALPROTELP, ALBUMINELP, A1GS, A2GS, BETS, BETA2SER, GAMS, MSPIKE, SPEI ?  Chemistry   ?   ?Component Value Date/Time  ? NA 139 10/11/2019 0859  ? K 3.4 (L) 10/11/2019 0859  ? CL 105 10/11/2019 0859  ? CO2 28 10/11/2019 0859  ? BUN 10 10/11/2019 0859  ? CREATININE 0.75 10/11/2019 0859  ?     ?Component Value Date/Time  ? CALCIUM 9.5 10/11/2019 0859  ? ALKPHOS 73 10/11/2019 0859  ? AST 20 10/11/2019 0859  ? ALT 12 10/11/2019 0859  ? BILITOT 0.4 10/11/2019 0859  ?  ? ? ? ?Impression and Plan: Ashley Stewart is a very pleasant 36 yo African American female with history of iron deficiency anemia secondary to heavy cycles and both alpha thalassemia minor and sickle cell traits.   ?We will get her set up for 2 units of blood on Monday as well as IV iron replacement.  ?She will also restart her daily folic acid.  ?Follow-up in 8 weeks.  ? ?Lottie Dawson, NP ?3/3/20233:31 PM ? ?

## 2021-09-15 ENCOUNTER — Other Ambulatory Visit: Payer: Self-pay | Admitting: Family

## 2021-09-15 ENCOUNTER — Inpatient Hospital Stay: Payer: BC Managed Care – PPO

## 2021-09-15 VITALS — BP 120/80 | HR 80 | Temp 97.9°F | Resp 18

## 2021-09-15 DIAGNOSIS — D649 Anemia, unspecified: Secondary | ICD-10-CM

## 2021-09-15 DIAGNOSIS — D500 Iron deficiency anemia secondary to blood loss (chronic): Secondary | ICD-10-CM | POA: Diagnosis not present

## 2021-09-15 DIAGNOSIS — D560 Alpha thalassemia: Secondary | ICD-10-CM

## 2021-09-15 DIAGNOSIS — D509 Iron deficiency anemia, unspecified: Secondary | ICD-10-CM | POA: Insufficient documentation

## 2021-09-15 DIAGNOSIS — D573 Sickle-cell trait: Secondary | ICD-10-CM

## 2021-09-15 LAB — FERRITIN: Ferritin: <4 ng/mL — ABNORMAL LOW (ref 11–307)

## 2021-09-15 LAB — IRON AND IRON BINDING CAPACITY (CC-WL,HP ONLY)
Iron: 18 ug/dL — ABNORMAL LOW (ref 28–170)
Saturation Ratios: 4 % — ABNORMAL LOW (ref 10.4–31.8)
TIBC: 475 ug/dL — ABNORMAL HIGH (ref 250–450)
UIBC: 457 ug/dL — ABNORMAL HIGH (ref 148–442)

## 2021-09-15 MED ORDER — SODIUM CHLORIDE 0.9 % IV SOLN: Freq: Once | INTRAVENOUS | Status: AC

## 2021-09-15 MED ORDER — DIPHENHYDRAMINE HCL 25 MG PO CAPS
25.0000 mg | ORAL_CAPSULE | Freq: Once | ORAL | Status: AC
  Administered 2021-09-15: 25 mg via ORAL
  Filled 2021-09-15: qty 1

## 2021-09-15 MED ORDER — SODIUM CHLORIDE 0.9% IV SOLUTION
250.0000 mL | Freq: Once | INTRAVENOUS | Status: AC
  Administered 2021-09-15: 250 mL via INTRAVENOUS

## 2021-09-15 MED ORDER — SODIUM CHLORIDE 0.9 % IV SOLN
300.0000 mg | Freq: Once | INTRAVENOUS | Status: AC
  Administered 2021-09-15: 300 mg via INTRAVENOUS
  Filled 2021-09-15: qty 300

## 2021-09-15 MED ORDER — ACETAMINOPHEN 325 MG PO TABS
650.0000 mg | ORAL_TABLET | Freq: Once | ORAL | Status: AC
  Administered 2021-09-15: 650 mg via ORAL
  Filled 2021-09-15: qty 2

## 2021-09-15 MED ORDER — SODIUM CHLORIDE 0.9% FLUSH: 10.0000 mL | INTRAVENOUS | Status: DC | PRN

## 2021-09-15 NOTE — Patient Instructions (Signed)
Iron Deficiency Anemia, Adult Iron deficiency anemia is when you do not have enough red blood cells or hemoglobin in your blood. This happens because you have too little iron in your body. Hemoglobin carries oxygen to parts of the body. Anemia can cause your body to not get enough oxygen. What are the causes? Not eating enough foods that have iron in them. The body not being able to take in iron well. Needing more iron due to pregnancy or heavy menstrual periods, for females. Cancer. Bleeding in the bowels. Many blood draws. What increases the risk? Being pregnant. Being a teenage girl going through a growth spurt. What are the signs or symptoms? Pale skin, lips, and nails. Weakness, dizziness, and getting tired easily. Headache. Feeling like you cannot breathe well when moving (shortness of breath). Cold hands and feet. Fast heartbeat or a heartbeat that is not regular. Feeling grouchy (irritable) or breathing fast. These are more common in very bad anemia. Mild anemia may not cause any symptoms. How is this treated? This condition is treated by finding out why you do not have enough iron and then getting more iron. It may include: Adding foods to your diet that have a lot of iron. Taking iron pills (supplements). If you are pregnant or breastfeeding, you may need to take extra iron. Your diet often does not provide the amount of iron that you need. Getting more vitamin C in your diet. Vitamin C helps your body take in iron. You may need to take iron pills with a glass of orange juice or vitamin C pills. Medicines to make heavy menstrual periods lighter. Surgery. You may need blood tests to see if treatment is working. If the treatment does not seem to be working, you may need more tests. Follow these instructions at home: Medicines Take over-the-counter and prescription medicines only as told by your doctor. This includes iron pills and vitamins. Take iron pills when your stomach is  empty. If you cannot handle this, take them with food. Do not drink milk or take antacids at the same time as your iron pills. Iron pills may turn your poop (stool)black. If you cannot handle taking iron pills by mouth, ask your doctor about getting iron through: An IV tube. A shot (injection) into a muscle. Eating and drinking  Talk with your doctor before changing the foods you eat. He or she may tell you to eat foods that have a lot of iron, such as: Liver. Low-fat (lean) beef. Breads and cereals that have iron added to them. Eggs. Dried fruit. Dark green, leafy vegetables. Eat fresh fruits and vegetables that are high in vitamin C. They help your body use iron. Foods with a lot of vitamin C include: Oranges. Peppers. Tomatoes. Mangoes. Drink enough fluid to keep your pee (urine) pale yellow. Managing constipation If you are taking iron pills, they may cause trouble pooping (constipation). To prevent or treat trouble pooping, you may need to: Take over-the-counter or prescription medicines. Eat foods that are high in fiber. These include beans, whole grains, and fresh fruits and vegetables. Limit foods that are high in fat and sugar. These include fried or sweet foods. General instructions Return to your normal activities as told by your doctor. Ask your doctor what activities are safe for you. Keep yourself clean, and keep things clean around you. Keep all follow-up visits as told by your doctor. This is important. Contact a doctor if: You feel like you may vomit (nauseous), or you vomit. You  feel weak. You are sweating for no reason. You have trouble pooping, such as: Pooping less than 3 times a week. Straining to poop. Having poop that is hard, dry, or larger than normal. Feeling full or bloated. Pain in the lower belly. Not feeling better after pooping. Get help right away if: You pass out (faint). You have chest pain. You have trouble breathing that: Is very  bad. Gets worse with physical activity. You have a fast heartbeat, or a heartbeat that does not feel regular. You get light-headed when getting up from sitting or lying down. These symptoms may be an emergency. Do not wait to see if the symptoms will go away. Get medical help right away. Call your local emergency services (911 in the [place] Do not drive yourself to the hospital. Summary Iron deficiency anemia is when you have too little iron in your body. This condition is treated by finding out why you do not have enough iron in your body and then getting more iron. Take over-the-counter and prescription medicines only as told by your doctor. Eat fresh fruits and vegetables that are high in vitamin C. Get help right away if you cannot breathe well. This information is not intended to replace advice given to you by your health care provider. Make sure you discuss any questions you have with your health care provider. Document Revised: 03/07/2019 Document Reviewed: 03/07/2019 Elsevier Patient Education  [employer].   Blood Transfusion, Adult A blood transfusion is a procedure in which you receive blood through an IV tube. You may need this procedure because of: A bleeding disorder. An illness. An injury. A surgery. The blood may come from someone else (a donor). You may also be able to donate blood for yourself. The blood given in a transfusion is made up of different types of cells. You may get: Red blood cells. These carry oxygen to the cells in the body. White blood cells. These help you fight infections. Platelets. These help your blood to clot. Plasma. This is the liquid part of your blood. It carries proteins and other substances through the body. If you have a clotting disorder, you may also get other types of blood products. Tell your doctor about: Any blood disorders you have. Any reactions you have had during a blood transfusion in the past. Any allergies you  have. All medicines you are taking, including vitamins, herbs, eye drops, creams, and over-the-counter medicines. Any surgeries you have had. Any medical conditions you have. This includes any recent fever or cold symptoms. Whether you are pregnant or may be pregnant. What are the risks? Generally, this is a safe procedure. However, problems may occur. The most common problems include: A mild allergic reaction. This includes red, swollen areas of skin (hives) and itching. Fever or chills. This may be the body's response to new blood cells received. This may happen during or up to 4 hours after the transfusion. More serious problems may include: Too much fluid in the lungs. This may cause breathing problems. A serious allergic reaction. This includes breathing trouble or swelling around the face and lips. Lung injury. This causes breathing trouble and low oxygen in the blood. This can happen within hours of the transfusion or days later. Too much iron. This can happen after getting many blood transfusions over a period of time. An infection or virus passed through the blood. This is rare. Donated blood is carefully tested before it is given. Your body's defense system (immune system) trying  to attack the new blood cells. This is rare. Symptoms may include fever, chills, nausea, low blood pressure, and low back or chest pain. Donated cells attacking healthy tissues. This is rare. What happens before the procedure? Medicines Ask your doctor about: Changing or stopping your normal medicines. This is important. Taking aspirin and ibuprofen. Do not take these medicines unless your doctor tells you to take them. Taking over-the-counter medicines, vitamins, herbs, and supplements. General instructions Follow instructions from your doctor about what you cannot eat or drink. You will have a blood test to find out your blood type. The test also finds out what type of blood your body will accept and  matches it to the donor type. If you are going to have a planned surgery, you may be able to donate your own blood. This may be done in case you need a transfusion. You will have your temperature, blood pressure, and pulse checked. You may receive medicine to help prevent an allergic reaction. This may be done if you have had a reaction to a transfusion before. This medicine may be given to you by mouth or through an IV tube. This procedure lasts about 1-4 hours. Plan for the time you need. What happens during the procedure?  An IV tube will be put into one of your veins. The bag of donated blood will be attached to your IV tube. Then, the blood will enter through your vein. Your temperature, blood pressure, and pulse will be checked often. This is done to find early signs of a transfusion reaction. Tell your nurse right away if you have any of these symptoms: Shortness of breath or trouble breathing. Chest or back pain. Fever or chills. Red, swollen areas of skin or itching. If you have any signs or symptoms of a reaction, your transfusion will be stopped. You may also be given medicine. When the transfusion is finished, your IV tube will be taken out. Pressure may be put on the IV site for a few minutes. A bandage (dressing) will be put on the IV site. The procedure may vary among doctors and hospitals. What happens after the procedure? You will be monitored until you leave the hospital or clinic. This includes checking your temperature, blood pressure, pulse, breathing rate, and blood oxygen level. Your blood may be tested to see how you are responding to the transfusion. You may be warmed with fluids or blankets. This is done to keep the temperature of your body normal. If you have your procedure in an outpatient setting, you will be told whom to contact to report any reactions. Where to find more information To learn more, visit the [employer]: redcross.org Summary A blood  transfusion is a procedure in which you are given blood through an IV tube. The blood may come from someone else (a donor). You may also be able to donate blood for yourself. The blood you are given is made up of different blood cells. You may receive red blood cells, platelets, plasma, or white blood cells. Your temperature, blood pressure, and pulse will be checked often. After the procedure, your blood may be tested to see how you are responding. This information is not intended to replace advice given to you by your health care provider. Make sure you discuss any questions you have with your health care provider. Document Revised: 12/22/2018 Document Reviewed: 12/22/2018 Elsevier Patient Education  [place].

## 2021-09-16 LAB — BPAM RBC
Blood Product Expiration Date: 202303212359
Blood Product Expiration Date: 202303212359
ISSUE DATE / TIME: 202303060752
ISSUE DATE / TIME: 202303060752
Unit Type and Rh: 7300
Unit Type and Rh: 7300

## 2021-09-16 LAB — TYPE AND SCREEN
ABO/RH(D): B POS
Antibody Screen: NEGATIVE
Unit division: 0
Unit division: 0

## 2021-09-18 ENCOUNTER — Telehealth: Payer: Self-pay | Admitting: *Deleted

## 2021-09-18 NOTE — Telephone Encounter (Signed)
Per 09/18/21 los - called and gave upcoming appointments ?

## 2021-09-23 ENCOUNTER — Other Ambulatory Visit: Payer: Self-pay

## 2021-09-23 ENCOUNTER — Inpatient Hospital Stay: Payer: BC Managed Care – PPO

## 2021-09-23 VITALS — BP 159/92 | HR 85 | Temp 99.3°F | Resp 20

## 2021-09-23 DIAGNOSIS — D509 Iron deficiency anemia, unspecified: Secondary | ICD-10-CM

## 2021-09-23 DIAGNOSIS — D500 Iron deficiency anemia secondary to blood loss (chronic): Secondary | ICD-10-CM | POA: Diagnosis not present

## 2021-09-23 MED ORDER — SODIUM CHLORIDE 0.9 % IV SOLN
300.0000 mg | Freq: Once | INTRAVENOUS | Status: AC
  Administered 2021-09-23: 300 mg via INTRAVENOUS
  Filled 2021-09-23: qty 300

## 2021-09-23 MED ORDER — SODIUM CHLORIDE 0.9 % IV SOLN: Freq: Once | INTRAVENOUS | Status: AC

## 2021-09-23 NOTE — Progress Notes (Signed)
Pt refused to stay for 30 minute wait post iron infusion.  BP is 159/92 at time of discharge.  Pt states that she did not take her BP medicine today and will go home and take it now before she goes to work. Celene Skeen NP notified and is ok for pt to be discharged to home now. Pt without complaints at time of discharge.  ?

## 2021-10-02 ENCOUNTER — Other Ambulatory Visit: Payer: Self-pay

## 2021-10-02 ENCOUNTER — Inpatient Hospital Stay: Payer: BC Managed Care – PPO

## 2021-10-02 VITALS — BP 118/78 | HR 70 | Temp 98.0°F | Resp 16

## 2021-10-02 DIAGNOSIS — D500 Iron deficiency anemia secondary to blood loss (chronic): Secondary | ICD-10-CM | POA: Diagnosis not present

## 2021-10-02 DIAGNOSIS — D509 Iron deficiency anemia, unspecified: Secondary | ICD-10-CM

## 2021-10-02 MED ORDER — SODIUM CHLORIDE 0.9 % IV SOLN
300.0000 mg | Freq: Once | INTRAVENOUS | Status: AC
  Administered 2021-10-02: 300 mg via INTRAVENOUS
  Filled 2021-10-02: qty 300

## 2021-10-02 MED ORDER — SODIUM CHLORIDE 0.9% FLUSH: 10.0000 mL | Freq: Once | INTRAVENOUS | Status: DC | PRN

## 2021-10-02 MED ORDER — SODIUM CHLORIDE 0.9% FLUSH: 3.0000 mL | Freq: Once | INTRAVENOUS | Status: DC | PRN

## 2021-10-02 MED ORDER — SODIUM CHLORIDE 0.9 % IV SOLN: Freq: Once | INTRAVENOUS | Status: AC

## 2021-10-02 NOTE — Patient Instructions (Signed)

## 2021-10-28 ENCOUNTER — Telehealth: Payer: Self-pay | Admitting: *Deleted

## 2021-10-28 NOTE — Telephone Encounter (Signed)
Called patient about rescheduling appointment - confirmed  ?

## 2021-10-31 ENCOUNTER — Other Ambulatory Visit: Payer: Self-pay | Admitting: Family

## 2021-10-31 DIAGNOSIS — D509 Iron deficiency anemia, unspecified: Secondary | ICD-10-CM

## 2021-10-31 DIAGNOSIS — D573 Sickle-cell trait: Secondary | ICD-10-CM

## 2021-11-03 ENCOUNTER — Inpatient Hospital Stay (HOSPITAL_BASED_OUTPATIENT_CLINIC_OR_DEPARTMENT_OTHER): Payer: BC Managed Care – PPO | Admitting: Family

## 2021-11-03 ENCOUNTER — Encounter: Payer: Self-pay | Admitting: Family

## 2021-11-03 ENCOUNTER — Inpatient Hospital Stay: Payer: BC Managed Care – PPO | Attending: Hematology & Oncology

## 2021-11-03 ENCOUNTER — Inpatient Hospital Stay: Payer: BC Managed Care – PPO

## 2021-11-03 ENCOUNTER — Inpatient Hospital Stay: Payer: BC Managed Care – PPO | Admitting: Family

## 2021-11-03 VITALS — BP 139/87 | HR 75 | Temp 98.5°F | Resp 17 | Wt 175.1 lb

## 2021-11-03 DIAGNOSIS — N920 Excessive and frequent menstruation with regular cycle: Secondary | ICD-10-CM | POA: Diagnosis present

## 2021-11-03 DIAGNOSIS — D573 Sickle-cell trait: Secondary | ICD-10-CM | POA: Diagnosis not present

## 2021-11-03 DIAGNOSIS — D500 Iron deficiency anemia secondary to blood loss (chronic): Secondary | ICD-10-CM | POA: Diagnosis present

## 2021-11-03 DIAGNOSIS — D509 Iron deficiency anemia, unspecified: Secondary | ICD-10-CM

## 2021-11-03 DIAGNOSIS — D560 Alpha thalassemia: Secondary | ICD-10-CM

## 2021-11-03 LAB — CBC WITH DIFFERENTIAL (CANCER CENTER ONLY)
Abs Immature Granulocytes: 0 10*3/uL (ref 0.00–0.07)
Basophils Absolute: 0 10*3/uL (ref 0.0–0.1)
Basophils Relative: 1 %
Eosinophils Absolute: 0.1 10*3/uL (ref 0.0–0.5)
Eosinophils Relative: 2 %
HCT: 36.3 % (ref 36.0–46.0)
Hemoglobin: 11.3 g/dL — ABNORMAL LOW (ref 12.0–15.0)
Immature Granulocytes: 0 %
Lymphocytes Relative: 54 %
Lymphs Abs: 2 10*3/uL (ref 0.7–4.0)
MCH: 23.4 pg — ABNORMAL LOW (ref 26.0–34.0)
MCHC: 31.1 g/dL (ref 30.0–36.0)
MCV: 75.3 fL — ABNORMAL LOW (ref 80.0–100.0)
Monocytes Absolute: 0.2 10*3/uL (ref 0.1–1.0)
Monocytes Relative: 7 %
Neutro Abs: 1.3 10*3/uL — ABNORMAL LOW (ref 1.7–7.7)
Neutrophils Relative %: 36 %
Platelet Count: 186 10*3/uL (ref 150–400)
RBC: 4.82 MIL/uL (ref 3.87–5.11)
RDW: 23.3 % — ABNORMAL HIGH (ref 11.5–15.5)
WBC Count: 3.6 10*3/uL — ABNORMAL LOW (ref 4.0–10.5)
nRBC: 0 % (ref 0.0–0.2)

## 2021-11-03 LAB — IRON AND IRON BINDING CAPACITY (CC-WL,HP ONLY)
Iron: 158 ug/dL (ref 28–170)
Saturation Ratios: 52 % — ABNORMAL HIGH (ref 10.4–31.8)
TIBC: 305 ug/dL (ref 250–450)
UIBC: 147 ug/dL — ABNORMAL LOW (ref 148–442)

## 2021-11-03 LAB — RETICULOCYTES
Immature Retic Fract: 6.5 % (ref 2.3–15.9)
RBC.: 4.82 MIL/uL (ref 3.87–5.11)
Retic Count, Absolute: 23.6 10*3/uL (ref 19.0–186.0)
Retic Ct Pct: 0.5 % (ref 0.4–3.1)

## 2021-11-03 LAB — FERRITIN: Ferritin: 70 ng/mL (ref 11–307)

## 2021-11-03 LAB — SAMPLE TO BLOOD BANK

## 2021-11-03 NOTE — Progress Notes (Signed)
?Hematology and Oncology Follow Up Visit ? ?Ashley Stewart ?[id number] ?1986/03/18 36 y.o. ?11/03/2021 ? ? ?Principle Diagnosis:  ?Iron deficiency anemia  ?Sickle cell trait ?Alpha thalassemia minor ?  ?Current Therapy:        ?IV iron as indicated ?Folic acid 1 mg PO daily  ?  ?Interim History:  Ashley Stewart is here today for follow-up. She is doing well but has noted increased fatigue over the last week.  ?Hgb is much improved at 11.3 and MCV 75! ?She had the Mirena placed recently and states that her last cycle was lighter in flow but she also had some break through spotting.  ?No other blood loss noted. No bruising or petechiae.  ?She denies fever, chills, n/v, cough, rash, dizziness, SOB, chest pain, palpitations, abdominal pain or changes in bowel or bladder habits.  ?No swelling, tenderness, numbness or tingling in her extremities. No falls or syncope.  ?She has maintained a good appetite and is doing her best to stay well hydrated. Her weight is stable at 175 lbs.  ? ?ECOG Performance Status: 1 - Symptomatic but completely ambulatory ? ?Medications:  ?Allergies as of 11/03/2021   ?No Known Allergies ?  ? ?  ?Medication List  ?  ? ?  ? Accurate as of November 03, 2021 10:46 AM. If you have any questions, ask your nurse or doctor.  ?  ?  ? ?  ? ?ALPRAZolam 0.25 MG tablet ?Commonly known as: Duanne Moron ?Take 1 tablet by mouth as needed. ?  ?folic acid 1 MG tablet ?Commonly known as: FOLVITE ?Take 1 tablet (1 mg total) by mouth daily. ?  ?Fusion Plus Caps ?Take 1 capsule by mouth daily. Unsure name and dose ?  ?losartan 25 MG tablet ?Commonly known as: COZAAR ?Take 25 mg by mouth daily. ?  ?Multivitamin Adult Tabs ?Take 1 tablet by mouth daily. ?  ?pantoprazole 40 MG tablet ?Commonly known as: PROTONIX ?TAKE 1 TABLET EVERY DAY AS NEEDED ?  ?tranexamic acid 650 MG Tabs tablet ?Commonly known as: LYSTEDA ?Take 1,300 mg by mouth 3 (three) times daily. First two days of period ?  ?Vitamin C 500 MG Caps ?Take 1 capsule by mouth  daily. ?  ?Vitamin D (Ergocalciferol) 1.25 MG (50000 UNIT) Caps capsule ?Commonly known as: DRISDOL ?Take 50,000 Units by mouth once a week. ?  ? ?  ? ? ?Allergies: No Known Allergies ? ?Past Medical History, Surgical history, Social history, and Family History were reviewed and updated. ? ?Review of Systems: ?All other 10 point review of systems is negative.  ? ?Physical Exam: ? weight is 175 lb 1.9 oz (79.4 kg). Her oral temperature is 98.5 ?F (36.9 ?C). Her blood pressure is 139/87 and her pulse is 75. Her respiration is 17 and oxygen saturation is 99%.  ? ?Wt Readings from Last 3 Encounters:  ?11/03/21 175 lb 1.9 oz (79.4 kg)  ?09/12/21 177 lb (80.3 kg)  ?03/04/20 170 lb (77.1 kg)  ? ? ?Ocular: Sclerae unicteric, pupils equal, round and reactive to light ?Ear-nose-throat: Oropharynx clear, dentition fair ?Lymphatic: No cervical or supraclavicular adenopathy ?Lungs no rales or rhonchi, good excursion bilaterally ?Heart regular rate and rhythm, no murmur appreciated ?Abd soft, nontender, positive bowel sounds ?MSK no focal spinal tenderness, no joint edema ?Neuro: non-focal, well-oriented, appropriate affect ?Breasts: Deferred  ? ?Lab Results  ?Component Value Date  ? WBC 4.3 09/12/2021  ? HGB 7.4 (L) 09/12/2021  ? HCT 25.1 (L) 09/12/2021  ? MCV 64.9 (L) 09/12/2021  ?  PLT 304 09/12/2021  ? ?Lab Results  ?Component Value Date  ? FERRITIN <4 (L) 09/12/2021  ? IRON 18 (L) 09/12/2021  ? TIBC 475 (H) 09/12/2021  ? UIBC 457 (H) 09/12/2021  ? IRONPCTSAT 4 (L) 09/12/2021  ? ?Lab Results  ?Component Value Date  ? RETICCTPCT 1.0 09/12/2021  ? RBC 3.87 09/12/2021  ? RBC 3.88 09/12/2021  ? ?No results found for: KPAFRELGTCHN, LAMBDASER, KAPLAMBRATIO ?No results found for: IGGSERUM, IGA, IGMSERUM ?No results found for: TOTALPROTELP, ALBUMINELP, A1GS, A2GS, BETS, BETA2SER, GAMS, MSPIKE, SPEI ?  Chemistry   ?   ?Component Value Date/Time  ? NA 139 10/11/2019 0859  ? K 3.4 (L) 10/11/2019 0859  ? CL 105 10/11/2019 0859  ? CO2 28  10/11/2019 0859  ? BUN 10 10/11/2019 0859  ? CREATININE 0.75 10/11/2019 0859  ?    ?Component Value Date/Time  ? CALCIUM 9.5 10/11/2019 0859  ? ALKPHOS 73 10/11/2019 0859  ? AST 20 10/11/2019 0859  ? ALT 12 10/11/2019 0859  ? BILITOT 0.4 10/11/2019 0859  ?  ? ? ? ?Impression and Plan: Ashley Stewart is a very pleasant 36 yo African American female with history of iron deficiency anemia secondary to heavy cycles and both alpha thalassemia minor and sickle cell traits.   ?She will continue taking her folic acid daily.  ?Iron studies are pending.  ?Follow-up in 3 months.  ? ?Ashley Dawson, NP ?4/24/202310:46 AM ? ?

## 2021-11-07 ENCOUNTER — Ambulatory Visit: Payer: BC Managed Care – PPO | Admitting: Family

## 2021-11-07 ENCOUNTER — Other Ambulatory Visit: Payer: BC Managed Care – PPO

## 2021-11-11 ENCOUNTER — Telehealth: Payer: Self-pay | Admitting: *Deleted

## 2021-11-11 NOTE — Telephone Encounter (Signed)
Per /24/23 los - called and gave upcoming appointments - confirmed ?

## 2021-11-11 NOTE — Telephone Encounter (Signed)
Call received from patient requesting most recent iron results from 11/03/21.  Pt informed that her iron saturation was 52 and to hold her iron pills at this time.  Celene Skeen NP notified.  Pt states that she will hold her iron pills and has no further questions at this time.  ?

## 2021-11-11 NOTE — Telephone Encounter (Signed)
Per 11/03/21 los - called and gave upcoming appointments - confirmed ?

## 2021-11-12 ENCOUNTER — Other Ambulatory Visit: Payer: BC Managed Care – PPO

## 2021-11-12 ENCOUNTER — Ambulatory Visit: Payer: BC Managed Care – PPO | Admitting: Family

## 2021-11-17 ENCOUNTER — Telehealth: Payer: Self-pay

## 2021-11-17 NOTE — Telephone Encounter (Signed)
-----   Message from Erenest Blank, NP sent at 11/17/2021  8:53 AM EDT ----- ?Regarding: FW: Iron Level ?She can take every other day :) ? ?----- Message ----- ?From: Margaretha Seeds, RN ?Sent: 11/11/2021  11:34 AM EDT ?To: Erenest Blank, NP ?Subject: Iron Level                                    ? ?She called for her iron results and I gave them to her.  She asked if she needs to hold her iron pill.  I told her to hold it and that I would ask you about holding her iron pill.  I wasn't sure if I was overstepping or not.  Can you please have one of the girls give her a call and let her know how long she should hold her iron pill? ? ?Thanks! ?Erie Noe ? ? ? ?

## 2021-11-17 NOTE — Telephone Encounter (Signed)
Pt advised and stated understanding  

## 2022-02-02 ENCOUNTER — Inpatient Hospital Stay: Payer: BC Managed Care – PPO | Attending: Hematology & Oncology

## 2022-02-02 ENCOUNTER — Inpatient Hospital Stay: Payer: BC Managed Care – PPO | Admitting: Family

## 2022-03-03 ENCOUNTER — Encounter (HOSPITAL_BASED_OUTPATIENT_CLINIC_OR_DEPARTMENT_OTHER): Payer: Self-pay | Admitting: Pharmacy Technician

## 2022-03-03 ENCOUNTER — Emergency Department (HOSPITAL_BASED_OUTPATIENT_CLINIC_OR_DEPARTMENT_OTHER)
Admission: EM | Admit: 2022-03-03 | Discharge: 2022-03-03 | Disposition: A | Discharge status: Home / Self Care | Payer: BC Managed Care – PPO | Attending: Emergency Medicine | Admitting: Emergency Medicine

## 2022-03-03 ENCOUNTER — Other Ambulatory Visit: Payer: Self-pay

## 2022-03-03 DIAGNOSIS — I10 Essential (primary) hypertension: Secondary | ICD-10-CM | POA: Insufficient documentation

## 2022-03-03 DIAGNOSIS — F419 Anxiety disorder, unspecified: Secondary | ICD-10-CM | POA: Insufficient documentation

## 2022-03-03 DIAGNOSIS — R55 Syncope and collapse: Secondary | ICD-10-CM | POA: Insufficient documentation

## 2022-03-03 DIAGNOSIS — R42 Dizziness and giddiness: Secondary | ICD-10-CM | POA: Insufficient documentation

## 2022-03-03 DIAGNOSIS — R209 Unspecified disturbances of skin sensation: Secondary | ICD-10-CM | POA: Diagnosis not present

## 2022-03-03 HISTORY — DX: Essential (primary) hypertension: I10

## 2022-03-03 LAB — BASIC METABOLIC PANEL
Anion gap: 7 (ref 5–15)
BUN: 8 mg/dL (ref 6–20)
CO2: 26 mmol/L (ref 22–32)
Calcium: 9.4 mg/dL (ref 8.9–10.3)
Chloride: 105 mmol/L (ref 98–111)
Creatinine, Ser: 0.78 mg/dL (ref 0.44–1.00)
GFR, Estimated: >60 mL/min (ref >60)
Glucose, Bld: 81 mg/dL (ref 70–99)
Potassium: 3.7 mmol/L (ref 3.5–5.1)
Sodium: 138 mmol/L (ref 135–145)

## 2022-03-03 LAB — CBC
HCT: 38.3 % (ref 36.0–46.0)
Hemoglobin: 12.5 g/dL (ref 12.0–15.0)
MCH: 26 pg (ref 26.0–34.0)
MCHC: 32.6 g/dL (ref 30.0–36.0)
MCV: 79.8 fL — ABNORMAL LOW (ref 80.0–100.0)
Platelets: 229 10*3/uL (ref 150–400)
RBC: 4.8 MIL/uL (ref 3.87–5.11)
RDW: 12.8 % (ref 11.5–15.5)
WBC: 5.4 10*3/uL (ref 4.0–10.5)
nRBC: 0 % (ref 0.0–0.2)

## 2022-03-03 LAB — TROPONIN I (HIGH SENSITIVITY): Troponin I (High Sensitivity): 3 ng/L (ref <18)

## 2022-03-03 MED ORDER — LOSARTAN POTASSIUM 25 MG PO TABS
50.0000 mg | ORAL_TABLET | Freq: Every day | ORAL | 0 refills | Status: DC
Start: 2022-03-03 — End: 2022-04-02
  Filled 2022-03-03: qty 60, 30d supply, fill #0

## 2022-03-03 NOTE — ED Notes (Signed)
Reviewed AVS/discharge instruction with patient. Time allotted for and all questions answered. Patient is agreeable for d/c and escorted to ed exit by staff.  

## 2022-03-03 NOTE — ED Provider Notes (Signed)
[facility] EMERGENCY DEPT Provider Note   CSN: [id number] Arrival date & time: 03/03/22  1542     History  Chief Complaint  Patient presents with   Dizziness    Ashley Stewart is a 36 y.o. female.   Dizziness    Has a history of hypertension and anxiety.  She presents to the ED for an acute episode of dizziness.  Patient states she was at work when she had a sudden episode of feeling dizzy lightheaded.  She felt the room moving somewhat but it was not clearly a room spinning sensation.  Patient states her coworker took her blood pressure was elevated 150/90.  She does have history of high blood pressure and has not taken her medications yet and normally she does take it in the morning.  She ended up taking one of her blood pressure medications.  Coworker discussed with her some of the possibilities patient started become somewhat anxious.  EMS was called.  They did note that her blood pressure was elevated 170/100.  Patient came to the ED for further evaluation.  She wanted to make sure she was not having a stroke or heart attack.  She did not have any trouble with her speech.  She has not had any focal numbness or weakness but she did experience some tingling in both of her hands and her feet.  She has not had any vision changes.  She denies any chest pain or shortness of breath.  Home Medications Prior to Admission medications   Medication Sig Start Date End Date Taking? Authorizing Provider  ALPRAZolam Prudy Feeler) 0.25 MG tablet Take 1 tablet by mouth as needed. 10/07/20   [provider]  folic acid (FOLVITE) 1 MG tablet Take 1 tablet (1 mg total) by mouth daily. 09/12/21   Erenest Blank, NP  Iron-FA-B Cmp-C-Biot-Probiotic (FUSION PLUS) CAPS Take 1 capsule by mouth daily. Unsure name and dose 08/23/19   [provider]  losartan (COZAAR) 25 MG tablet Take 2 tablets (50 mg total) by mouth daily. 03/03/22   Linwood Dibbles, MD  pantoprazole (PROTONIX) 40 MG tablet  TAKE 1 TABLET EVERY DAY AS NEEDED    [provider]  Vitamin D, Ergocalciferol, (DRISDOL) 1.25 MG (50000 UNIT) CAPS capsule Take 50,000 Units by mouth once a week. Patient not taking: Reported on 09/12/2021 09/24/19   [provider]      Allergies    Patient has no known allergies.    Review of Systems   Review of Systems  Neurological:  Positive for dizziness.    Physical Exam Updated Vital Signs BP (!) 159/100   Pulse 74   Temp 98.3 F (36.8 C) (Oral)   Resp 17   SpO2 100%  Physical Exam Vitals and nursing note reviewed.  Constitutional:      General: She is not in acute distress.    Appearance: She is well-developed.  HENT:     Head: Normocephalic and atraumatic.     Right Ear: External ear normal.     Left Ear: External ear normal.  Eyes:     General: No scleral icterus.       Right eye: No discharge.        Left eye: No discharge.     Conjunctiva/sclera: Conjunctivae normal.  Neck:     Trachea: No tracheal deviation.  Cardiovascular:     Rate and Rhythm: Normal rate and regular rhythm.  Pulmonary:     Effort: Pulmonary effort is normal. No  respiratory distress.     Breath sounds: Normal breath sounds. No stridor. No wheezing or rales.  Abdominal:     General: Bowel sounds are normal. There is no distension.     Palpations: Abdomen is soft.     Tenderness: There is no abdominal tenderness. There is no guarding or rebound.  Musculoskeletal:        General: No tenderness.     Cervical back: Neck supple.  Skin:    General: Skin is warm and dry.     Findings: No rash.  Neurological:     Mental Status: She is alert and oriented to person, place, and time.     Cranial Nerves: No cranial nerve deficit.     Sensory: No sensory deficit.     Motor: No abnormal muscle tone or seizure activity.     Coordination: Coordination normal.     Comments: No pronator drift bilateral upper extrem, able to hold both legs off bed for 5 seconds, sensation intact  in all extremities, no visual field cuts, no left or right sided neglect, normal finger-nose exam bilaterally, no nystagmus noted  No facial droop, extraocular movements intact, tongue midline     ED Results / Procedures / Treatments   Labs (all labs ordered are listed, but only abnormal results are displayed) Labs Reviewed  CBC - Abnormal; Notable for the following components:      Result Value   MCV 79.8 (*)    All other components within normal limits  BASIC METABOLIC PANEL  TROPONIN I (HIGH SENSITIVITY)    EKG EKG Interpretation  Date/Time:  Tuesday March 03 2022 15:53:06 EDT Ventricular Rate:  72 PR Interval:  166 QRS Duration: 83 QT Interval:  371 QTC Calculation: 406 R Axis:   65 Text Interpretation: Sinus rhythm Consider left ventricular hypertrophy No significant change since last tracing Confirmed by Linwood Dibbles [phone]) on 03/03/2022 4:09:59 PM  Radiology No results found.  Procedures Procedures    Medications Ordered in ED Medications - No data to display  ED Course/ Medical Decision Making/ A&P Clinical Course as of 03/03/22 1759  Tue Mar 03, 2022  1712 CBC(!) Normal [JK]    Clinical Course User Index [JK] Linwood Dibbles, MD                           Medical Decision Making Amount and/or Complexity of Data Reviewed Labs: ordered. Decision-making details documented in ED Course.  Risk Prescription drug management.   Patient presented to the ED for evaluation after a near syncopal episode at work.  Patient abruptly came dizzy and lightheaded.  Symptoms not clearly vertiginous in nature.  She did have some tingling and paresthesias but this did occur after becoming somewhat anxious and her symptoms were bilateral.  Her evaluation is reassuring.  She has no focal neurologic deficits.  I doubt stroke TIA.  Her EKG is reassuring she is not having any chest pain and her troponin is normal.  I doubt acute coronary syndrome.  Is possible she may have had a  vasovagal episode.  Patient also had not taken her blood pressure medications and it was elevated.  Unclear if the blood pressure was the trigger or the stress of the event causing her hypertension.  She has been monitored in the ED and has not had any recurrent episodes.  Recommend outpatient follow-up PCP.  Consider possible outpatient cardiac monitor if she has any recurrent episodes.  With her  elevated blood pressure I do recommend increasing her Cozaar to 50 mg/day.  Patient states her blood pressure has been running borderline high 140s over 80s to 90s despite her 25 mg dose.  She does plan to follow-up with her primary doctor closely        Final Clinical Impression(s) / ED Diagnoses Final diagnoses:  Dizziness  Near syncope  Hypertension, unspecified type    Rx / DC Orders ED Discharge Orders          Ordered    losartan (COZAAR) 25 MG tablet  Daily        03/03/22 Carlyn Reichert, MD 03/03/22 1801

## 2022-03-03 NOTE — ED Triage Notes (Signed)
Pt here with reports of sitting down at work when she suddenly became dizzy almost as if the room was spinning. Pt states a coworker took her BP and was elevated at 150's/90's. Pt endorses after that she had a panic attack and having L arm tightness. Pt called EMS and when they came out to assess her her BP 170/100, 12 lead was unremarkable.

## 2022-03-03 NOTE — Discharge Instructions (Addendum)
Your test today in the ED were reassuring.  The exact cause of your near fainting spell is unclear but there is no signs to suggest any serious cause at this time.  Your blood pressure was elevated.  Increase your losartan to 50 mg daily.  Follow-up with your primary care doctor as we discussed for further evaluation

## 2022-03-04 ENCOUNTER — Encounter: Payer: Self-pay | Admitting: Family

## 2022-03-04 ENCOUNTER — Other Ambulatory Visit (HOSPITAL_BASED_OUTPATIENT_CLINIC_OR_DEPARTMENT_OTHER): Payer: Self-pay

## 2022-03-06 ENCOUNTER — Other Ambulatory Visit (HOSPITAL_BASED_OUTPATIENT_CLINIC_OR_DEPARTMENT_OTHER): Payer: Self-pay

## 2022-04-02 ENCOUNTER — Other Ambulatory Visit (HOSPITAL_BASED_OUTPATIENT_CLINIC_OR_DEPARTMENT_OTHER): Payer: Self-pay

## 2022-04-02 MED ORDER — LOSARTAN POTASSIUM 25 MG PO TABS
ORAL_TABLET | ORAL | 0 refills | Status: DC
  Filled 2022-04-02: qty 60, 30d supply, fill #0

## 2022-04-06 ENCOUNTER — Other Ambulatory Visit: Payer: Self-pay | Admitting: *Deleted

## 2022-04-06 DIAGNOSIS — D509 Iron deficiency anemia, unspecified: Secondary | ICD-10-CM

## 2022-04-06 MED ORDER — FUSION PLUS PO CAPS: 1.0000 | ORAL_CAPSULE | Freq: Every day | ORAL | 3 refills | Status: DC

## 2022-04-29 ENCOUNTER — Inpatient Hospital Stay: Payer: BC Managed Care – PPO

## 2022-04-29 ENCOUNTER — Inpatient Hospital Stay: Payer: BC Managed Care – PPO | Admitting: Family

## 2023-01-22 ENCOUNTER — Other Ambulatory Visit: Payer: Self-pay | Admitting: Family

## 2023-01-22 DIAGNOSIS — D509 Iron deficiency anemia, unspecified: Secondary | ICD-10-CM

## 2023-01-22 DIAGNOSIS — D560 Alpha thalassemia: Secondary | ICD-10-CM

## 2023-01-22 DIAGNOSIS — D573 Sickle-cell trait: Secondary | ICD-10-CM

## 2023-01-25 ENCOUNTER — Encounter: Payer: Self-pay | Admitting: Medical Oncology

## 2023-01-25 ENCOUNTER — Inpatient Hospital Stay: Payer: BC Managed Care – PPO | Attending: Hematology & Oncology

## 2023-01-25 ENCOUNTER — Inpatient Hospital Stay: Payer: BC Managed Care – PPO | Admitting: Medical Oncology

## 2023-01-25 DIAGNOSIS — D509 Iron deficiency anemia, unspecified: Secondary | ICD-10-CM

## 2023-01-25 DIAGNOSIS — D560 Alpha thalassemia: Secondary | ICD-10-CM

## 2023-01-25 DIAGNOSIS — N920 Excessive and frequent menstruation with regular cycle: Secondary | ICD-10-CM | POA: Insufficient documentation

## 2023-01-25 DIAGNOSIS — D500 Iron deficiency anemia secondary to blood loss (chronic): Secondary | ICD-10-CM | POA: Insufficient documentation

## 2023-01-25 DIAGNOSIS — D573 Sickle-cell trait: Secondary | ICD-10-CM

## 2023-01-25 DIAGNOSIS — Z79899 Other long term (current) drug therapy: Secondary | ICD-10-CM | POA: Insufficient documentation

## 2023-01-25 LAB — CBC WITH DIFFERENTIAL (CANCER CENTER ONLY)
Abs Immature Granulocytes: 0.02 10*3/uL (ref 0.00–0.07)
Basophils Absolute: 0 10*3/uL (ref 0.0–0.1)
Basophils Relative: 1 %
Eosinophils Absolute: 0 10*3/uL (ref 0.0–0.5)
Eosinophils Relative: 1 %
HCT: 27.9 % — ABNORMAL LOW (ref 36.0–46.0)
Hemoglobin: 8.3 g/dL — ABNORMAL LOW (ref 12.0–15.0)
Immature Granulocytes: 1 %
Lymphocytes Relative: 41 %
Lymphs Abs: 1.6 10*3/uL (ref 0.7–4.0)
MCH: 20.8 pg — ABNORMAL LOW (ref 26.0–34.0)
MCHC: 29.7 g/dL — ABNORMAL LOW (ref 30.0–36.0)
MCV: 69.8 fL — ABNORMAL LOW (ref 80.0–100.0)
Monocytes Absolute: 0.4 10*3/uL (ref 0.1–1.0)
Monocytes Relative: 10 %
Neutro Abs: 1.9 10*3/uL (ref 1.7–7.7)
Neutrophils Relative %: 46 %
Platelet Count: 281 10*3/uL (ref 150–400)
RBC: 4 MIL/uL (ref 3.87–5.11)
RDW: 18 % — ABNORMAL HIGH (ref 11.5–15.5)
WBC Count: 3.9 10*3/uL — ABNORMAL LOW (ref 4.0–10.5)
nRBC: 0 % (ref 0.0–0.2)

## 2023-01-25 LAB — RETICULOCYTES
Immature Retic Fract: 24.3 % — ABNORMAL HIGH (ref 2.3–15.9)
RBC.: 4.03 MIL/uL (ref 3.87–5.11)
Retic Count, Absolute: 43.9 10*3/uL (ref 19.0–186.0)
Retic Ct Pct: 1.1 % (ref 0.4–3.1)

## 2023-01-25 LAB — SAMPLE TO BLOOD BANK

## 2023-01-25 LAB — FERRITIN: Ferritin: 4 ng/mL — ABNORMAL LOW (ref 11–307)

## 2023-01-25 MED ORDER — FUSION PLUS PO CAPS: 1.0000 | ORAL_CAPSULE | Freq: Every day | ORAL | 3 refills | Status: DC

## 2023-01-25 MED ORDER — FOLIC ACID 1 MG PO TABS: 1.0000 mg | ORAL_TABLET | Freq: Every day | ORAL | 3 refills | Status: DC

## 2023-01-25 NOTE — Progress Notes (Signed)
Hematology and Oncology Follow Up Visit  Ashley Stewart [id number] March 12, 1986 37 y.o. 01/25/2023   Principle Diagnosis:  Iron deficiency anemia  Sickle cell trait Alpha thalassemia minor   Current Therapy:        IV iron as indicated Folic acid 1 mg PO daily    Interim History:  Ashley Stewart is here today for follow-up. She feels fatigued and as if she needs iron.   Had a Mirena to help her heavy menses but had this removed to see if it would help with her blood pressure. It did not so she is thinking about getting it again. Currently menstrual cycle is heavy and 7 days in length.   She does well with IV Venofer. Her last infusion was on 10/02/2021. She does well with this. She is not taking her iron supplement daily as she often forgets. She also has not been taking her folic acid supplement and asks for a refill.   No other blood loss noted. No bruising or petechiae.  She denies fever, chills, n/v, cough, rash, dizziness, SOB, chest pain, palpitations, abdominal pain or changes in bowel or bladder habits.  No swelling, tenderness, numbness or tingling in her extremities. No falls or syncope.  She has maintained a good appetite and is doing her best to stay well hydrated.   Wt Readings from Last 3 Encounters:  01/25/23 164 lb 12.8 oz (74.8 kg)  11/03/21 175 lb 1.9 oz (79.4 kg)  09/12/21 177 lb (80.3 kg)     ECOG Performance Status: 1 - Symptomatic but completely ambulatory  Medications:  Allergies as of 01/25/2023   No Known Allergies      Medication List        Accurate as of January 25, 2023  1:53 PM. If you have any questions, ask your nurse or doctor.          ALPRAZolam 0.25 MG tablet Commonly known as: XANAX Take 1 tablet by mouth as needed.   amLODipine 2.5 MG tablet Commonly known as: NORVASC Take 2.5 mg by mouth daily.   clobetasol cream 0.05 % Commonly known as: TEMOVATE Apply 1 Application topically 2 (two) times daily.   diclofenac 50 MG EC  tablet Commonly known as: VOLTAREN 1 tablet Orally Twice a day with food for head ache   folic acid 1 MG tablet Commonly known as: FOLVITE Take 1 tablet (1 mg total) by mouth daily.   Fusion Plus Caps Take 1 capsule by mouth daily.   losartan 25 MG tablet Commonly known as: COZAAR Take 2 tablets (50 mg total) by mouth daily.   pantoprazole 40 MG tablet Commonly known as: PROTONIX TAKE 1 TABLET EVERY DAY AS NEEDED   Vitamin D (Ergocalciferol) 1.25 MG (50000 UNIT) Caps capsule Commonly known as: DRISDOL Take 50,000 Units by mouth once a week.        Allergies: No Known Allergies  Past Medical History, Surgical history, Social history, and Family History were reviewed and updated.  Review of Systems: All other 10 point review of systems is negative.   Physical Exam:  height is 5\' 9"  (1.753 m) and weight is 164 lb 12.8 oz (74.8 kg). Her oral temperature is 97.9 F (36.6 C). Her pulse is 68. Her respiration is 17.   Wt Readings from Last 3 Encounters:  01/25/23 164 lb 12.8 oz (74.8 kg)  11/03/21 175 lb 1.9 oz (79.4 kg)  09/12/21 177 lb (80.3 kg)    Ocular: Sclerae unicteric, pupils equal, round and  reactive to light Ear-nose-throat: Oropharynx clear, dentition fair Lymphatic: No cervical or supraclavicular adenopathy Lungs no rales or rhonchi, good excursion bilaterally Heart regular rate and rhythm, no murmur appreciated Abd soft, nontender, positive bowel sounds MSK no focal spinal tenderness, no joint edema Neuro: non-focal, well-oriented, appropriate affect Breasts: Deferred   Lab Results  Component Value Date   WBC 3.9 (L) 01/25/2023   HGB 8.3 (L) 01/25/2023   HCT 27.9 (L) 01/25/2023   MCV 69.8 (L) 01/25/2023   PLT 281 01/25/2023   Lab Results  Component Value Date   FERRITIN 70 11/03/2021   IRON 158 11/03/2021   TIBC 305 11/03/2021   UIBC 147 (L) 11/03/2021   IRONPCTSAT 52 (H) 11/03/2021   Lab Results  Component Value Date   RETICCTPCT 1.1  01/25/2023   RBC 4.00 01/25/2023   RBC 4.03 01/25/2023   No results found for: "KPAFRELGTCHN", "LAMBDASER", "KAPLAMBRATIO" No results found for: "IGGSERUM", "IGA", "IGMSERUM" No results found for: "TOTALPROTELP", "ALBUMINELP", "A1GS", "A2GS", "BETS", "BETA2SER", "GAMS", "MSPIKE", "SPEI"   Chemistry      Component Value Date/Time   NA 138 03/03/2022 1629   K 3.7 03/03/2022 1629   CL 105 03/03/2022 1629   CO2 26 03/03/2022 1629   BUN 8 03/03/2022 1629   CREATININE 0.78 03/03/2022 1629   CREATININE 0.75 10/11/2019 0859      Component Value Date/Time   CALCIUM 9.4 03/03/2022 1629   ALKPHOS 73 10/11/2019 0859   AST 20 10/11/2019 0859   ALT 12 10/11/2019 0859   BILITOT 0.4 10/11/2019 0859      Impression and Plan: Ashley Stewart is a very pleasant 37 yo African American female with history of iron deficiency anemia secondary to heavy cycles and both alpha thalassemia minor and sickle cell traits.  Review of her CBC shows a Hgb of 8.3 (down from 12.5 10 months ago) as well as an MCV of 69.8. Immature reticulocyte counts is up from 6.5 to 24.3. She would benefit from iron. Iron orders placed. I have encouraged her to discuss her heavy menses with her GYN. She may benefit from another Mirena or even the ParaGard   Disposition:  No blood tomorrow She will restart her folic acid supplement daily She will take her iron supplement daily IV Venofer once weekly x 3 weeks total RTC 3 months APP, labs (CBC w/, CMP, retic, iron, ferritin)-Ashley Stewart   Ashley Chestnut, PA-C 7/15/20241:53 PM

## 2023-01-26 ENCOUNTER — Inpatient Hospital Stay: Payer: BC Managed Care – PPO

## 2023-01-26 LAB — IRON AND IRON BINDING CAPACITY (CC-WL,HP ONLY)
Iron: 19 ug/dL — ABNORMAL LOW (ref 28–170)
Saturation Ratios: 4 % — ABNORMAL LOW (ref 10.4–31.8)
TIBC: 482 ug/dL — ABNORMAL HIGH (ref 250–450)
UIBC: 463 ug/dL — ABNORMAL HIGH (ref 148–442)

## 2023-02-02 ENCOUNTER — Inpatient Hospital Stay: Payer: BC Managed Care – PPO

## 2023-02-09 ENCOUNTER — Inpatient Hospital Stay: Payer: BC Managed Care – PPO

## 2023-02-09 VITALS — BP 122/73 | HR 68 | Temp 98.6°F | Resp 17

## 2023-02-09 DIAGNOSIS — D509 Iron deficiency anemia, unspecified: Secondary | ICD-10-CM

## 2023-02-09 DIAGNOSIS — D500 Iron deficiency anemia secondary to blood loss (chronic): Secondary | ICD-10-CM | POA: Diagnosis not present

## 2023-02-09 MED ORDER — SODIUM CHLORIDE 0.9 % IV SOLN: Freq: Once | INTRAVENOUS | Status: AC

## 2023-02-09 MED ORDER — SODIUM CHLORIDE 0.9 % IV SOLN
300.0000 mg | Freq: Once | INTRAVENOUS | Status: AC
  Administered 2023-02-09: 300 mg via INTRAVENOUS
  Filled 2023-02-09: qty 300

## 2023-02-09 NOTE — Patient Instructions (Signed)
Iron Sucrose Injection What is this medication? IRON SUCROSE (EYE ern SOO krose) treats low levels of iron (iron deficiency anemia) in people with kidney disease. Iron is a mineral that plays an important role in making red blood cells, which carry oxygen from your lungs to the rest of your body. This medicine may be used for other purposes; ask your health care provider or pharmacist if you have questions. COMMON BRAND NAME(S): Venofer What should I tell my care team before I take this medication? They need to know if you have any of these conditions: Anemia not caused by low iron levels Heart disease High levels of iron in the blood Kidney disease Liver disease An unusual or allergic reaction to iron, other medications, foods, dyes, or preservatives Pregnant or trying to get pregnant Breastfeeding How should I use this medication? This medication is for infusion into a vein. It is given in a hospital or clinic setting. Talk to your care team about the use of this medication in children. While this medication may be prescribed for children as young as 2 years for selected conditions, precautions do apply. Overdosage: If you think you have taken too much of this medicine contact a poison control center or emergency room at once. NOTE: This medicine is only for you. Do not share this medicine with others. What if I miss a dose? Keep appointments for follow-up doses. It is important not to miss your dose. Call your care team if you are unable to keep an appointment. What may interact with this medication? Do not take this medication with any of the following: Deferoxamine Dimercaprol Other iron products This medication may also interact with the following: Chloramphenicol Deferasirox This list may not describe all possible interactions. Give your health care provider a list of all the medicines, herbs, non-prescription drugs, or dietary supplements you use. Also tell them if you smoke,  drink alcohol, or use illegal drugs. Some items may interact with your medicine. What should I watch for while using this medication? Visit your care team regularly. Tell your care team if your symptoms do not start to get better or if they get worse. You may need blood work done while you are taking this medication. You may need to follow a special diet. Talk to your care team. Foods that contain iron include: whole grains/cereals, dried fruits, beans, or peas, leafy green vegetables, and organ meats (liver, kidney). What side effects may I notice from receiving this medication? Side effects that you should report to your care team as soon as possible: Allergic reactions--skin rash, itching, hives, swelling of the face, lips, tongue, or throat Low blood pressure--dizziness, feeling faint or lightheaded, blurry vision Shortness of breath Side effects that usually do not require medical attention (report to your care team if they continue or are bothersome): Flushing Headache Joint pain Muscle pain Nausea Pain, redness, or irritation at injection site This list may not describe all possible side effects. Call your doctor for medical advice about side effects. You may report side effects to FDA at 1-800-FDA-1088. Where should I keep my medication? This medication is given in a hospital or clinic. It will not be stored at home. NOTE: This sheet is a summary. It may not cover all possible information. If you have questions about this medicine, talk to your doctor, pharmacist, or health care provider.  2024 Elsevier/Gold Standard (2022-12-04 00:00:00)

## 2023-02-16 ENCOUNTER — Inpatient Hospital Stay: Payer: BC Managed Care – PPO | Attending: Hematology & Oncology

## 2023-02-16 VITALS — BP 118/84 | HR 77 | Temp 98.3°F | Resp 20

## 2023-02-16 DIAGNOSIS — D509 Iron deficiency anemia, unspecified: Secondary | ICD-10-CM

## 2023-02-16 DIAGNOSIS — N920 Excessive and frequent menstruation with regular cycle: Secondary | ICD-10-CM | POA: Diagnosis present

## 2023-02-16 DIAGNOSIS — D500 Iron deficiency anemia secondary to blood loss (chronic): Secondary | ICD-10-CM | POA: Diagnosis present

## 2023-02-16 MED ORDER — SODIUM CHLORIDE 0.9 % IV SOLN: Freq: Once | INTRAVENOUS | Status: AC

## 2023-02-16 MED ORDER — SODIUM CHLORIDE 0.9 % IV SOLN
300.0000 mg | Freq: Once | INTRAVENOUS | Status: AC
  Administered 2023-02-16: 300 mg via INTRAVENOUS
  Filled 2023-02-16: qty 300

## 2023-02-16 NOTE — Progress Notes (Signed)
Pt refused to stay for 30 minutes post iron infusion.  Pt without complaints at the time of discharge.

## 2023-02-16 NOTE — Patient Instructions (Signed)
Iron Sucrose Injection What is this medication? IRON SUCROSE (EYE ern SOO krose) treats low levels of iron (iron deficiency anemia) in people with kidney disease. Iron is a mineral that plays an important role in making red blood cells, which carry oxygen from your lungs to the rest of your body. This medicine may be used for other purposes; ask your health care provider or pharmacist if you have questions. COMMON BRAND NAME(S): Venofer What should I tell my care team before I take this medication? They need to know if you have any of these conditions: Anemia not caused by low iron levels Heart disease High levels of iron in the blood Kidney disease Liver disease An unusual or allergic reaction to iron, other medications, foods, dyes, or preservatives Pregnant or trying to get pregnant Breastfeeding How should I use this medication? This medication is for infusion into a vein. It is given in a hospital or clinic setting. Talk to your care team about the use of this medication in children. While this medication may be prescribed for children as young as 2 years for selected conditions, precautions do apply. Overdosage: If you think you have taken too much of this medicine contact a poison control center or emergency room at once. NOTE: This medicine is only for you. Do not share this medicine with others. What if I miss a dose? Keep appointments for follow-up doses. It is important not to miss your dose. Call your care team if you are unable to keep an appointment. What may interact with this medication? Do not take this medication with any of the following: Deferoxamine Dimercaprol Other iron products This medication may also interact with the following: Chloramphenicol Deferasirox This list may not describe all possible interactions. Give your health care provider a list of all the medicines, herbs, non-prescription drugs, or dietary supplements you use. Also tell them if you smoke,  drink alcohol, or use illegal drugs. Some items may interact with your medicine. What should I watch for while using this medication? Visit your care team regularly. Tell your care team if your symptoms do not start to get better or if they get worse. You may need blood work done while you are taking this medication. You may need to follow a special diet. Talk to your care team. Foods that contain iron include: whole grains/cereals, dried fruits, beans, or peas, leafy green vegetables, and organ meats (liver, kidney). What side effects may I notice from receiving this medication? Side effects that you should report to your care team as soon as possible: Allergic reactions--skin rash, itching, hives, swelling of the face, lips, tongue, or throat Low blood pressure--dizziness, feeling faint or lightheaded, blurry vision Shortness of breath Side effects that usually do not require medical attention (report to your care team if they continue or are bothersome): Flushing Headache Joint pain Muscle pain Nausea Pain, redness, or irritation at injection site This list may not describe all possible side effects. Call your doctor for medical advice about side effects. You may report side effects to FDA at 1-800-FDA-1088. Where should I keep my medication? This medication is given in a hospital or clinic. It will not be stored at home. NOTE: This sheet is a summary. It may not cover all possible information. If you have questions about this medicine, talk to your doctor, pharmacist, or health care provider.  2024 Elsevier/Gold Standard (2022-12-04 00:00:00)

## 2023-02-23 ENCOUNTER — Inpatient Hospital Stay: Payer: BC Managed Care – PPO

## 2023-02-26 ENCOUNTER — Inpatient Hospital Stay: Payer: BC Managed Care – PPO

## 2023-02-26 VITALS — BP 112/84 | HR 67 | Temp 97.0°F | Resp 19

## 2023-02-26 DIAGNOSIS — D500 Iron deficiency anemia secondary to blood loss (chronic): Secondary | ICD-10-CM | POA: Diagnosis not present

## 2023-02-26 DIAGNOSIS — D509 Iron deficiency anemia, unspecified: Secondary | ICD-10-CM

## 2023-02-26 MED ORDER — SODIUM CHLORIDE 0.9 % IV SOLN: Freq: Once | INTRAVENOUS | Status: AC

## 2023-02-26 MED ORDER — SODIUM CHLORIDE 0.9 % IV SOLN
300.0000 mg | Freq: Once | INTRAVENOUS | Status: AC
  Administered 2023-02-26: 300 mg via INTRAVENOUS
  Filled 2023-02-26: qty 300

## 2023-02-26 NOTE — Patient Instructions (Signed)
 Iron Sucrose Injection What is this medication? IRON SUCROSE (EYE ern SOO krose) treats low levels of iron (iron deficiency anemia) in people with kidney disease. Iron is a mineral that plays an important role in making red blood cells, which carry oxygen from your lungs to the rest of your body. This medicine may be used for other purposes; ask your health care provider or pharmacist if you have questions. COMMON BRAND NAME(S): Venofer What should I tell my care team before I take this medication? They need to know if you have any of these conditions: Anemia not caused by low iron levels Heart disease High levels of iron in the blood Kidney disease Liver disease An unusual or allergic reaction to iron, other medications, foods, dyes, or preservatives Pregnant or trying to get pregnant Breastfeeding How should I use this medication? This medication is for infusion into a vein. It is given in a hospital or clinic setting. Talk to your care team about the use of this medication in children. While this medication may be prescribed for children as young as 2 years for selected conditions, precautions do apply. Overdosage: If you think you have taken too much of this medicine contact a poison control center or emergency room at once. NOTE: This medicine is only for you. Do not share this medicine with others. What if I miss a dose? Keep appointments for follow-up doses. It is important not to miss your dose. Call your care team if you are unable to keep an appointment. What may interact with this medication? Do not take this medication with any of the following: Deferoxamine Dimercaprol Other iron products This medication may also interact with the following: Chloramphenicol Deferasirox This list may not describe all possible interactions. Give your health care provider a list of all the medicines, herbs, non-prescription drugs, or dietary supplements you use. Also tell them if you smoke,  drink alcohol, or use illegal drugs. Some items may interact with your medicine. What should I watch for while using this medication? Visit your care team regularly. Tell your care team if your symptoms do not start to get better or if they get worse. You may need blood work done while you are taking this medication. You may need to follow a special diet. Talk to your care team. Foods that contain iron include: whole grains/cereals, dried fruits, beans, or peas, leafy green vegetables, and organ meats (liver, kidney). What side effects may I notice from receiving this medication? Side effects that you should report to your care team as soon as possible: Allergic reactions--skin rash, itching, hives, swelling of the face, lips, tongue, or throat Low blood pressure--dizziness, feeling faint or lightheaded, blurry vision Shortness of breath Side effects that usually do not require medical attention (report to your care team if they continue or are bothersome): Flushing Headache Joint pain Muscle pain Nausea Pain, redness, or irritation at injection site This list may not describe all possible side effects. Call your doctor for medical advice about side effects. You may report side effects to FDA at 1-800-FDA-1088. Where should I keep my medication? This medication is given in a hospital or clinic. It will not be stored at home. NOTE: This sheet is a summary. It may not cover all possible information. If you have questions about this medicine, talk to your doctor, pharmacist, or health care provider.  2024 Elsevier/Gold Standard (2022-12-04 00:00:00)

## 2023-04-27 ENCOUNTER — Inpatient Hospital Stay: Payer: BC Managed Care – PPO | Admitting: Medical Oncology

## 2023-04-27 ENCOUNTER — Encounter: Payer: Self-pay | Admitting: Medical Oncology

## 2023-04-27 ENCOUNTER — Inpatient Hospital Stay: Payer: BC Managed Care – PPO | Attending: Hematology & Oncology

## 2023-07-12 ENCOUNTER — Encounter: Payer: Self-pay | Admitting: Family

## 2023-07-20 ENCOUNTER — Encounter: Payer: Self-pay | Admitting: Family

## 2023-07-20 ENCOUNTER — Ambulatory Visit
Admission: RE | Admit: 2023-07-20 | Discharge: 2023-07-20 | Disposition: A | Discharge status: Home / Self Care | Payer: 59 | Source: Ambulatory Visit | Attending: Obstetrics and Gynecology | Admitting: Obstetrics and Gynecology

## 2023-07-20 ENCOUNTER — Other Ambulatory Visit: Payer: Self-pay | Admitting: Obstetrics and Gynecology

## 2023-07-20 DIAGNOSIS — T8332XD Displacement of intrauterine contraceptive device, subsequent encounter: Secondary | ICD-10-CM

## 2023-09-23 ENCOUNTER — Other Ambulatory Visit: Payer: Self-pay | Admitting: Medical Oncology

## 2023-09-27 ENCOUNTER — Inpatient Hospital Stay: Attending: Hematology & Oncology

## 2023-09-27 ENCOUNTER — Encounter: Payer: Self-pay | Admitting: Medical Oncology

## 2023-09-27 ENCOUNTER — Inpatient Hospital Stay: Admitting: Medical Oncology

## 2023-09-27 ENCOUNTER — Other Ambulatory Visit: Payer: Self-pay | Admitting: *Deleted

## 2023-09-27 VITALS — BP 129/76 | HR 84 | Temp 98.7°F | Resp 18 | Ht 69.0 in | Wt 177.0 lb

## 2023-09-27 DIAGNOSIS — D573 Sickle-cell trait: Secondary | ICD-10-CM

## 2023-09-27 DIAGNOSIS — D696 Thrombocytopenia, unspecified: Secondary | ICD-10-CM | POA: Insufficient documentation

## 2023-09-27 DIAGNOSIS — D563 Thalassemia minor: Secondary | ICD-10-CM | POA: Diagnosis not present

## 2023-09-27 DIAGNOSIS — D509 Iron deficiency anemia, unspecified: Secondary | ICD-10-CM | POA: Insufficient documentation

## 2023-09-27 DIAGNOSIS — D560 Alpha thalassemia: Secondary | ICD-10-CM | POA: Diagnosis not present

## 2023-09-27 LAB — CBC WITH DIFFERENTIAL (CANCER CENTER ONLY)
Abs Immature Granulocytes: 0 10*3/uL (ref 0.00–0.07)
Basophils Absolute: 0 10*3/uL (ref 0.0–0.1)
Basophils Relative: 1 %
Eosinophils Absolute: 0 10*3/uL (ref 0.0–0.5)
Eosinophils Relative: 1 %
HCT: 28.7 % — ABNORMAL LOW (ref 36.0–46.0)
Hemoglobin: 8.4 g/dL — ABNORMAL LOW (ref 12.0–15.0)
Immature Granulocytes: 0 %
Lymphocytes Relative: 33 %
Lymphs Abs: 1.3 10*3/uL (ref 0.7–4.0)
MCH: 19.9 pg — ABNORMAL LOW (ref 26.0–34.0)
MCHC: 29.3 g/dL — ABNORMAL LOW (ref 30.0–36.0)
MCV: 68 fL — ABNORMAL LOW (ref 80.0–100.0)
Monocytes Absolute: 0.6 10*3/uL (ref 0.1–1.0)
Monocytes Relative: 15 %
Neutro Abs: 2.1 10*3/uL (ref 1.7–7.7)
Neutrophils Relative %: 50 %
Platelet Count: 413 10*3/uL — ABNORMAL HIGH (ref 150–400)
RBC: 4.22 MIL/uL (ref 3.87–5.11)
RDW: 18.7 % — ABNORMAL HIGH (ref 11.5–15.5)
WBC Count: 4 10*3/uL (ref 4.0–10.5)
nRBC: 0 % (ref 0.0–0.2)

## 2023-09-27 LAB — CMP (CANCER CENTER ONLY)
ALT: 8 U/L (ref 0–44)
AST: 17 U/L (ref 15–41)
Albumin: 4.1 g/dL (ref 3.5–5.0)
Alkaline Phosphatase: 50 U/L (ref 38–126)
Anion gap: 7 (ref 5–15)
BUN: 5 mg/dL — ABNORMAL LOW (ref 6–20)
CO2: 26 mmol/L (ref 22–32)
Calcium: 8.6 mg/dL — ABNORMAL LOW (ref 8.9–10.3)
Chloride: 104 mmol/L (ref 98–111)
Creatinine: 0.66 mg/dL (ref 0.44–1.00)
GFR, Estimated: >60 mL/min (ref >60)
Glucose, Bld: 97 mg/dL (ref 70–99)
Potassium: 3.3 mmol/L — ABNORMAL LOW (ref 3.5–5.1)
Sodium: 137 mmol/L (ref 135–145)
Total Bilirubin: 0.6 mg/dL (ref 0.0–1.2)
Total Protein: 7.3 g/dL (ref 6.5–8.1)

## 2023-09-27 LAB — RETICULOCYTES
Immature Retic Fract: 23.5 % — ABNORMAL HIGH (ref 2.3–15.9)
RBC.: 4.17 MIL/uL (ref 3.87–5.11)
Retic Count, Absolute: 50 10*3/uL (ref 19.0–186.0)
Retic Ct Pct: 1.2 % (ref 0.4–3.1)

## 2023-09-27 LAB — SAMPLE TO BLOOD BANK

## 2023-09-27 LAB — IRON AND IRON BINDING CAPACITY (CC-WL,HP ONLY)
Iron: 17 ug/dL — ABNORMAL LOW (ref 28–170)
Saturation Ratios: 3 % — ABNORMAL LOW (ref 10.4–31.8)
TIBC: 508 ug/dL — ABNORMAL HIGH (ref 250–450)
UIBC: 491 ug/dL — ABNORMAL HIGH (ref 148–442)

## 2023-09-27 LAB — FERRITIN: Ferritin: 3 ng/mL — ABNORMAL LOW (ref 11–307)

## 2023-09-27 MED ORDER — FOLIC ACID 1 MG PO TABS: 1.0000 mg | ORAL_TABLET | Freq: Every day | ORAL | 3 refills | Status: DC

## 2023-09-27 NOTE — Progress Notes (Signed)
 Hematology and Oncology Follow Up Visit  Ashley Stewart [id number] Dec 03, 1985 38 y.o. 09/27/2023   Principle Diagnosis:  Iron deficiency anemia  Sickle cell trait Alpha thalassemia minor   Current Therapy:        IV iron as indicated Folic acid 1 mg PO daily    Interim History:  Ashley Stewart is here today for follow-up.   She has been feeling fatigued and feels as if her iron levels and Hgb are low.   She has been working with GYN to help lighten her menstrual cycles. Currently she has a heavy menstrual cycles once per month and then spots for many more days. Currently spotting. No other blood loss noted. No bruising or petechiae.   She has been treated with IV Venofer in the past and has done well with this. She reports that she did not feel the improvement in energy after her infusions like she normally does. Her last infusion series was in July/August 2024.   She has run out of her folic acid and iron supplement. She has not taken these for many months but is agreeable to restart.   She denies fever, chills, n/v, cough, rash, dizziness, SOB, chest pain, palpitations, abdominal pain or changes in bowel or bladder habits.  No swelling, tenderness, numbness or tingling in her extremities. No falls or syncope.  She has maintained a good appetite and is doing her best to stay well hydrated.   Wt Readings from Last 3 Encounters:  09/27/23 177 lb (80.3 kg)  01/25/23 164 lb 12.8 oz (74.8 kg)  11/03/21 175 lb 1.9 oz (79.4 kg)     ECOG Performance Status: 1 - Symptomatic but completely ambulatory  Medications:  Allergies as of 09/27/2023   No Known Allergies      Medication List        Accurate as of September 27, 2023 10:11 AM. If you have any questions, ask your nurse or doctor.          STOP taking these medications    clobetasol cream 0.05 % Commonly known as: TEMOVATE Stopped by: [name]   diclofenac 50 MG EC tablet Commonly known as: VOLTAREN Stopped by:  [name]       TAKE these medications    ALPRAZolam 0.25 MG tablet Commonly known as: XANAX Take 1 tablet by mouth as needed.   amLODipine 2.5 MG tablet Commonly known as: NORVASC Take 2.5 mg by mouth daily.   folic acid 1 MG tablet Commonly known as: FOLVITE Take 1 tablet (1 mg total) by mouth daily.   Fusion Plus Caps Take 1 capsule by mouth daily.   losartan 100 MG tablet Commonly known as: COZAAR Take 100 mg by mouth daily. What changed: Another medication with the same name was removed. Continue taking this medication, and follow the directions you see here. Changed by: [name]   pantoprazole 40 MG tablet Commonly known as: PROTONIX TAKE 1 TABLET EVERY DAY AS NEEDED   Vitamin D (Ergocalciferol) 1.25 MG (50000 UNIT) Caps capsule Commonly known as: DRISDOL Take 50,000 Units by mouth once a week.        Allergies: No Known Allergies  Past Medical History, Surgical history, Social history, and Family History were reviewed and updated.  Review of Systems: All other 10 point review of systems is negative.   Physical Exam:  height is 5\' 9"  (1.753 m) and weight is 177 lb (80.3 kg). Her oral temperature is 98.7 F (37.1 C).  Her blood pressure is 129/76 and her pulse is 84. Her respiration is 18 and oxygen saturation is 100%.   Wt Readings from Last 3 Encounters:  09/27/23 177 lb (80.3 kg)  01/25/23 164 lb 12.8 oz (74.8 kg)  11/03/21 175 lb 1.9 oz (79.4 kg)    Ocular: Sclerae unicteric, pupils equal, round and reactive to light Ear-nose-throat: Oropharynx clear, dentition fair Lymphatic: No cervical or supraclavicular adenopathy Lungs no rales or rhonchi, good excursion bilaterally Heart regular rate and rhythm, no murmur appreciated Abd soft, nontender, positive bowel sounds MSK no focal spinal tenderness, no joint edema Neuro: non-focal, well-oriented, appropriate affect  Lab Results  Component Value Date   WBC 4.0 09/27/2023   HGB  8.4 (L) 09/27/2023   HCT 28.7 (L) 09/27/2023   MCV 68.0 (L) 09/27/2023   PLT 413 (H) 09/27/2023   Lab Results  Component Value Date   FERRITIN 4 (L) 01/25/2023   IRON 19 (L) 01/25/2023   TIBC 482 (H) 01/25/2023   UIBC 463 (H) 01/25/2023   IRONPCTSAT 4 (L) 01/25/2023   Lab Results  Component Value Date   RETICCTPCT 1.2 09/27/2023   RBC 4.17 09/27/2023   RBC 4.22 09/27/2023   No results found for: "KPAFRELGTCHN", "LAMBDASER", "KAPLAMBRATIO" No results found for: "IGGSERUM", "IGA", "IGMSERUM" No results found for: "TOTALPROTELP", "ALBUMINELP", "A1GS", "A2GS", "BETS", "BETA2SER", "GAMS", "MSPIKE", "SPEI"   Chemistry      Component Value Date/Time   NA 137 09/27/2023 0905   K 3.3 (L) 09/27/2023 0905   CL 104 09/27/2023 0905   CO2 26 09/27/2023 0905   BUN 5 (L) 09/27/2023 0905   CREATININE 0.66 09/27/2023 0905      Component Value Date/Time   CALCIUM 8.6 (L) 09/27/2023 0905   ALKPHOS 50 09/27/2023 0905   AST 17 09/27/2023 0905   ALT 8 09/27/2023 0905   BILITOT 0.6 09/27/2023 0905     Encounter Diagnoses  Name Primary?   Sickle cell trait (HCC)    Iron deficiency anemia, unspecified iron deficiency anemia type Yes   Alpha thalassemia (HCC)     Impression and Plan: Ashley Stewart is a very pleasant 38 yo [place] American female with history of iron deficiency anemia secondary to heavy cycles and both alpha thalassemia minor and sickle cell traits.  Review of her CBC shows a Hgb of 8.4 as well as an MCV of 68. Immature reticulocyte counts is up to 23.5. She has thrombocytopenia.   She would benefit from iron. Iron orders placed.   She will also restart her folic acid supplement.   Disposition:  No blood tomorrow She will restart her folic acid supplement daily She will take her iron supplement daily IV Venofer once weekly x 3 weeks total RTC 2 months APP, labs (CBC w/, retic, iron, ferritin)-[place]   [name], PA-C 3/17/202510:11 AM

## 2023-10-01 ENCOUNTER — Inpatient Hospital Stay

## 2023-10-01 VITALS — BP 122/80 | HR 64 | Temp 98.1°F | Resp 17

## 2023-10-01 DIAGNOSIS — D509 Iron deficiency anemia, unspecified: Secondary | ICD-10-CM | POA: Diagnosis not present

## 2023-10-01 MED ORDER — SODIUM CHLORIDE 0.9 % IV SOLN
300.0000 mg | Freq: Once | INTRAVENOUS | Status: AC
  Administered 2023-10-01: 300 mg via INTRAVENOUS
  Filled 2023-10-01: qty 300

## 2023-10-01 MED ORDER — SODIUM CHLORIDE 0.9 % IV SOLN: Freq: Once | INTRAVENOUS | Status: AC

## 2023-10-01 NOTE — Patient Instructions (Signed)

## 2023-10-08 ENCOUNTER — Inpatient Hospital Stay

## 2023-10-08 VITALS — BP 125/71 | HR 68 | Temp 98.4°F | Resp 17

## 2023-10-08 DIAGNOSIS — D509 Iron deficiency anemia, unspecified: Secondary | ICD-10-CM | POA: Diagnosis not present

## 2023-10-08 MED ORDER — SODIUM CHLORIDE 0.9 % IV SOLN: Freq: Once | INTRAVENOUS | Status: AC

## 2023-10-08 MED ORDER — SODIUM CHLORIDE 0.9 % IV SOLN
300.0000 mg | Freq: Once | INTRAVENOUS | Status: AC
  Administered 2023-10-08: 300.0065 mg via INTRAVENOUS
  Filled 2023-10-08: qty 300

## 2023-10-08 NOTE — Patient Instructions (Signed)

## 2023-10-15 ENCOUNTER — Inpatient Hospital Stay: Attending: Medical Oncology

## 2023-10-15 VITALS — BP 127/77 | HR 77 | Temp 98.0°F | Resp 17

## 2023-10-15 DIAGNOSIS — D509 Iron deficiency anemia, unspecified: Secondary | ICD-10-CM | POA: Diagnosis present

## 2023-10-15 MED ORDER — SODIUM CHLORIDE 0.9 % IV SOLN
Freq: Once | INTRAVENOUS | Status: AC
Start: 2023-10-15 — End: 2023-10-15

## 2023-10-15 MED ORDER — SODIUM CHLORIDE 0.9 % IV SOLN
300.0000 mg | Freq: Once | INTRAVENOUS | Status: AC
  Administered 2023-10-15: 300 mg via INTRAVENOUS
  Filled 2023-10-15: qty 300

## 2023-10-15 NOTE — Patient Instructions (Signed)

## 2023-11-24 ENCOUNTER — Inpatient Hospital Stay: Attending: Medical Oncology

## 2023-11-24 ENCOUNTER — Inpatient Hospital Stay (HOSPITAL_BASED_OUTPATIENT_CLINIC_OR_DEPARTMENT_OTHER): Admitting: Medical Oncology

## 2023-11-24 ENCOUNTER — Ambulatory Visit: Payer: Self-pay | Admitting: Medical Oncology

## 2023-11-24 ENCOUNTER — Encounter: Payer: Self-pay | Admitting: Medical Oncology

## 2023-11-24 VITALS — BP 126/83 | HR 80 | Temp 98.9°F | Resp 17 | Wt 174.1 lb

## 2023-11-24 DIAGNOSIS — D560 Alpha thalassemia: Secondary | ICD-10-CM

## 2023-11-24 DIAGNOSIS — D573 Sickle-cell trait: Secondary | ICD-10-CM

## 2023-11-24 DIAGNOSIS — D563 Thalassemia minor: Secondary | ICD-10-CM | POA: Diagnosis not present

## 2023-11-24 DIAGNOSIS — D509 Iron deficiency anemia, unspecified: Secondary | ICD-10-CM | POA: Insufficient documentation

## 2023-11-24 LAB — RETIC PANEL
Immature Retic Fract: 18.1 % — ABNORMAL HIGH (ref 2.3–15.9)
RBC.: 3.59 MIL/uL — ABNORMAL LOW (ref 3.87–5.11)
Retic Count, Absolute: 31.2 10*3/uL (ref 19.0–186.0)
Retic Ct Pct: 0.9 % (ref 0.4–3.1)
Reticulocyte Hemoglobin: 25.9 pg — ABNORMAL LOW (ref >27.9)

## 2023-11-24 LAB — CBC WITH DIFFERENTIAL (CANCER CENTER ONLY)
Abs Immature Granulocytes: 0.03 10*3/uL (ref 0.00–0.07)
Basophils Absolute: 0 10*3/uL (ref 0.0–0.1)
Basophils Relative: 1 %
Eosinophils Absolute: 0.1 10*3/uL (ref 0.0–0.5)
Eosinophils Relative: 2 %
HCT: 27.2 % — ABNORMAL LOW (ref 36.0–46.0)
Hemoglobin: 8.3 g/dL — ABNORMAL LOW (ref 12.0–15.0)
Immature Granulocytes: 1 %
Lymphocytes Relative: 32 %
Lymphs Abs: 1.7 10*3/uL (ref 0.7–4.0)
MCH: 22.8 pg — ABNORMAL LOW (ref 26.0–34.0)
MCHC: 30.5 g/dL (ref 30.0–36.0)
MCV: 74.7 fL — ABNORMAL LOW (ref 80.0–100.0)
Monocytes Absolute: 0.5 10*3/uL (ref 0.1–1.0)
Monocytes Relative: 9 %
Neutro Abs: 3 10*3/uL (ref 1.7–7.7)
Neutrophils Relative %: 55 %
Platelet Count: 248 10*3/uL (ref 150–400)
RBC: 3.64 MIL/uL — ABNORMAL LOW (ref 3.87–5.11)
RDW: 20.7 % — ABNORMAL HIGH (ref 11.5–15.5)
WBC Count: 5.4 10*3/uL (ref 4.0–10.5)
nRBC: 0 % (ref 0.0–0.2)

## 2023-11-24 LAB — IRON AND IRON BINDING CAPACITY (CC-WL,HP ONLY)
Iron: 25 ug/dL — ABNORMAL LOW (ref 28–170)
Saturation Ratios: 6 % — ABNORMAL LOW (ref 10.4–31.8)
TIBC: 435 ug/dL (ref 250–450)
UIBC: 410 ug/dL (ref 148–442)

## 2023-11-24 LAB — FERRITIN: Ferritin: 24 ng/mL (ref 11–307)

## 2023-11-24 NOTE — Progress Notes (Signed)
 Hematology and Oncology Follow Up Visit  Ashley Stewart [id number] 07-Jul-1986 38 y.o. 11/24/2023   Principle Diagnosis:  Iron  deficiency anemia  Sickle cell trait Alpha thalassemia minor   Current Therapy:        IV iron  as indicated Folic acid  1 mg PO daily    Interim History:  Ashley Stewart is here today for follow-up.   She has been feeling fatigued and feels as if her iron  levels and Hgb are still low.   She has been working with GYN to help lighten her menstrual cycles. She has been having significant menstrual cycles and spotting. No other blood loss noted. No bruising or petechiae. She has follow up with GYN next week.   She has been treated with IV Venofer  in the past and has done well with this. She reports that she did not feel the improvement in energy after her infusions like she normally does. Her last infusion series was in July/August 2024.   She is on her folic acid  supplement  She denies fever, chills, n/v, cough, rash, dizziness, SOB, chest pain, palpitations, abdominal pain or changes in bowel or bladder habits.  No swelling, tenderness, numbness or tingling in her extremities. No falls or syncope.  She has maintained a good appetite and is doing her best to stay well hydrated.   Wt Readings from Last 3 Encounters:  11/24/23 174 lb 1.3 oz (79 kg)  09/27/23 177 lb (80.3 kg)  01/25/23 164 lb 12.8 oz (74.8 kg)     ECOG Performance Status: 1 - Symptomatic but completely ambulatory  Medications:  Allergies as of 11/24/2023   No Known Allergies      Medication List        Accurate as of Nov 24, 2023 10:17 AM. If you have any questions, ask your nurse or doctor.          ALPRAZolam 0.25 MG tablet Commonly known as: XANAX Take 1 tablet by mouth as needed.   amLODipine 2.5 MG tablet Commonly known as: NORVASC Take 2.5 mg by mouth daily.   folic acid  1 MG tablet Commonly known as: FOLVITE  Take 1 tablet (1 mg total) by mouth daily.   folic acid  1 MG  tablet Commonly known as: FOLVITE  Take 1 tablet (1 mg total) by mouth daily.   Fusion Plus Caps Take 1 capsule by mouth daily.   losartan  100 MG tablet Commonly known as: COZAAR  Take 100 mg by mouth daily.   pantoprazole 40 MG tablet Commonly known as: PROTONIX TAKE 1 TABLET EVERY DAY AS NEEDED   Vitamin D (Ergocalciferol) 1.25 MG (50000 UNIT) Caps capsule Commonly known as: DRISDOL Take 50,000 Units by mouth once a week.        Allergies: No Known Allergies  Past Medical History, Surgical history, Social history, and Family History were reviewed and updated.  Review of Systems: All other 10 point review of systems is negative.   Physical Exam:  weight is 174 lb 1.3 oz (79 kg). Her oral temperature is 98.9 F (37.2 C). Her blood pressure is 126/83 and her pulse is 80. Her respiration is 17 and oxygen saturation is 100%.   Wt Readings from Last 3 Encounters:  11/24/23 174 lb 1.3 oz (79 kg)  09/27/23 177 lb (80.3 kg)  01/25/23 164 lb 12.8 oz (74.8 kg)    Ocular: Sclerae unicteric, pupils equal, round and reactive to light Ear-nose-throat: Oropharynx clear, dentition fair Lymphatic: No cervical or supraclavicular adenopathy Lungs no rales or rhonchi,  good excursion bilaterally Heart regular rate and rhythm, no murmur appreciated MSK no obvious joint edema Neuro: non-focal, well-oriented, appropriate affect  Lab Results  Component Value Date   WBC 5.4 11/24/2023   HGB 8.3 (L) 11/24/2023   HCT 27.2 (L) 11/24/2023   MCV 74.7 (L) 11/24/2023   PLT 248 11/24/2023   Lab Results  Component Value Date   FERRITIN 3 (L) 09/27/2023   IRON  17 (L) 09/27/2023   TIBC 508 (H) 09/27/2023   UIBC 491 (H) 09/27/2023   IRONPCTSAT 3 (L) 09/27/2023   Lab Results  Component Value Date   RETICCTPCT 0.9 11/24/2023   RBC 3.59 (L) 11/24/2023   RBC 3.64 (L) 11/24/2023   No results found for: "KPAFRELGTCHN", "LAMBDASER", "KAPLAMBRATIO" No results found for: "IGGSERUM", "IGA",  "IGMSERUM" No results found for: "TOTALPROTELP", "ALBUMINELP", "A1GS", "A2GS", "BETS", "BETA2SER", "GAMS", "MSPIKE", "SPEI"   Chemistry      Component Value Date/Time   NA 137 09/27/2023 0905   K 3.3 (L) 09/27/2023 0905   CL 104 09/27/2023 0905   CO2 26 09/27/2023 0905   BUN 5 (L) 09/27/2023 0905   CREATININE 0.66 09/27/2023 0905      Component Value Date/Time   CALCIUM 8.6 (L) 09/27/2023 0905   ALKPHOS 50 09/27/2023 0905   AST 17 09/27/2023 0905   ALT 8 09/27/2023 0905   BILITOT 0.6 09/27/2023 0905     Encounter Diagnoses  Name Primary?   Iron  deficiency anemia, unspecified iron  deficiency anemia type Yes   Sickle cell trait (HCC)    Alpha thalassemia (HCC)    Impression and Plan: Ashley Stewart is a very pleasant 38 yo [place] American female with history of iron  deficiency anemia secondary to heavy cycles and both alpha thalassemia minor and sickle cell traits.  Review of her CBC shows a Hgb of 8.3 as well as an MCV of 74.7. Immature reticulocyte counts is 18.1.   She would benefit from iron . Iron  orders placed.   She will also restart her folic acid  supplement.   Disposition:  She continue folic acid  supplement daily IV Venofer  once weekly x 3 weeks total RTC 1 month APP, labs (CBC w/, retic, iron , ferritin, sample)-[place]   Sharla Davis, PA-C 5/14/202510:17 AM

## 2023-12-08 ENCOUNTER — Inpatient Hospital Stay

## 2023-12-16 ENCOUNTER — Inpatient Hospital Stay

## 2023-12-16 ENCOUNTER — Telehealth: Payer: Self-pay

## 2023-12-16 NOTE — Telephone Encounter (Signed)
 Received phone call from patient who stated she had not started her next round of iron  infusions as she has a consultation with her gynecologist next week. Pt is planning to proceed with a hysterectomy and was inquiring if she should do the iron  infusions before the procedure or after.  Reviewed pt concerns and questions with Sunnie England, PA who stated pt should receive all 3 infusions of iron  prior to the surgery so that her recovery is easier. Pt also educated that since she hasn't received her iron  infusions her appt with Dian Formosa will be moved back 2 months. Pt verbalized understanding and appreciative of answers.  Pt had no further needs identified.

## 2023-12-22 ENCOUNTER — Inpatient Hospital Stay

## 2023-12-22 ENCOUNTER — Inpatient Hospital Stay: Admitting: Medical Oncology

## 2023-12-24 ENCOUNTER — Inpatient Hospital Stay

## 2023-12-30 ENCOUNTER — Inpatient Hospital Stay: Admitting: Medical Oncology

## 2023-12-30 ENCOUNTER — Inpatient Hospital Stay

## 2023-12-31 ENCOUNTER — Inpatient Hospital Stay: Attending: Medical Oncology

## 2023-12-31 VITALS — BP 128/86 | HR 63 | Temp 97.9°F | Resp 17

## 2023-12-31 DIAGNOSIS — D509 Iron deficiency anemia, unspecified: Secondary | ICD-10-CM | POA: Diagnosis present

## 2023-12-31 MED ORDER — SODIUM CHLORIDE 0.9 % IV SOLN
300.0000 mg | Freq: Once | INTRAVENOUS | Status: AC
  Administered 2023-12-31: 300 mg via INTRAVENOUS
  Filled 2023-12-31: qty 300

## 2023-12-31 MED ORDER — SODIUM CHLORIDE 0.9 % IV SOLN: Freq: Once | INTRAVENOUS | Status: AC

## 2023-12-31 NOTE — Patient Instructions (Signed)

## 2024-01-10 ENCOUNTER — Inpatient Hospital Stay

## 2024-01-10 ENCOUNTER — Ambulatory Visit

## 2024-01-11 ENCOUNTER — Inpatient Hospital Stay: Attending: Hematology & Oncology

## 2024-01-11 VITALS — BP 124/80 | HR 81 | Temp 98.9°F | Resp 17

## 2024-01-11 DIAGNOSIS — D509 Iron deficiency anemia, unspecified: Secondary | ICD-10-CM | POA: Diagnosis present

## 2024-01-11 MED ORDER — SODIUM CHLORIDE 0.9 % IV SOLN
300.0000 mg | Freq: Once | INTRAVENOUS | Status: AC
  Administered 2024-01-11: 300 mg via INTRAVENOUS
  Filled 2024-01-11: qty 300

## 2024-01-11 MED ORDER — SODIUM CHLORIDE 0.9 % IV SOLN: Freq: Once | INTRAVENOUS | Status: AC

## 2024-01-11 NOTE — Patient Instructions (Signed)

## 2024-01-17 ENCOUNTER — Inpatient Hospital Stay

## 2024-01-17 VITALS — BP 122/82 | HR 66 | Temp 99.7°F | Resp 18

## 2024-01-17 DIAGNOSIS — D509 Iron deficiency anemia, unspecified: Secondary | ICD-10-CM

## 2024-01-17 MED ORDER — SODIUM CHLORIDE 0.9 % IV SOLN
300.0000 mg | Freq: Once | INTRAVENOUS | Status: AC
  Administered 2024-01-17: 300 mg via INTRAVENOUS
  Filled 2024-01-17: qty 300

## 2024-01-17 MED ORDER — SODIUM CHLORIDE 0.9 % IV SOLN: Freq: Once | INTRAVENOUS | Status: AC

## 2024-01-17 NOTE — Progress Notes (Signed)
Patient declined to stay for the post infusion observation period. Patient denies any difficulty with this infusion in the past and is aware to call with any questions or concerns.   Pt verbalized understanding and had no further questions today.   

## 2024-01-17 NOTE — Patient Instructions (Signed)

## 2024-02-14 ENCOUNTER — Inpatient Hospital Stay: Admitting: Medical Oncology

## 2024-02-14 ENCOUNTER — Inpatient Hospital Stay

## 2024-02-15 ENCOUNTER — Inpatient Hospital Stay: Admitting: Medical Oncology

## 2024-02-15 ENCOUNTER — Inpatient Hospital Stay

## 2024-02-16 ENCOUNTER — Inpatient Hospital Stay (HOSPITAL_BASED_OUTPATIENT_CLINIC_OR_DEPARTMENT_OTHER): Admitting: Medical Oncology

## 2024-02-16 ENCOUNTER — Inpatient Hospital Stay: Attending: Hematology & Oncology

## 2024-02-16 ENCOUNTER — Emergency Department (HOSPITAL_COMMUNITY)
Admission: EM | Admit: 2024-02-16 | Discharge: 2024-02-16 | Disposition: A | Discharge status: Home / Self Care | Source: Ambulatory Visit | Attending: Emergency Medicine | Admitting: Emergency Medicine

## 2024-02-16 ENCOUNTER — Other Ambulatory Visit: Payer: Self-pay

## 2024-02-16 ENCOUNTER — Encounter: Payer: Self-pay | Admitting: Medical Oncology

## 2024-02-16 ENCOUNTER — Other Ambulatory Visit: Payer: Self-pay | Admitting: *Deleted

## 2024-02-16 VITALS — BP 109/76 | HR 79 | Temp 99.3°F | Resp 18 | Ht 69.0 in | Wt 171.4 lb

## 2024-02-16 DIAGNOSIS — D509 Iron deficiency anemia, unspecified: Secondary | ICD-10-CM | POA: Insufficient documentation

## 2024-02-16 DIAGNOSIS — D560 Alpha thalassemia: Secondary | ICD-10-CM | POA: Diagnosis not present

## 2024-02-16 DIAGNOSIS — D573 Sickle-cell trait: Secondary | ICD-10-CM

## 2024-02-16 DIAGNOSIS — N939 Abnormal uterine and vaginal bleeding, unspecified: Secondary | ICD-10-CM | POA: Diagnosis not present

## 2024-02-16 DIAGNOSIS — Z79899 Other long term (current) drug therapy: Secondary | ICD-10-CM | POA: Insufficient documentation

## 2024-02-16 DIAGNOSIS — D563 Thalassemia minor: Secondary | ICD-10-CM | POA: Insufficient documentation

## 2024-02-16 DIAGNOSIS — D500 Iron deficiency anemia secondary to blood loss (chronic): Secondary | ICD-10-CM | POA: Insufficient documentation

## 2024-02-16 DIAGNOSIS — I10 Essential (primary) hypertension: Secondary | ICD-10-CM | POA: Diagnosis not present

## 2024-02-16 LAB — CBC WITH DIFFERENTIAL (CANCER CENTER ONLY)
Abs Immature Granulocytes: 0.01 K/uL (ref 0.00–0.07)
Basophils Absolute: 0 K/uL (ref 0.0–0.1)
Basophils Relative: 1 %
Eosinophils Absolute: 0 K/uL (ref 0.0–0.5)
Eosinophils Relative: 1 %
HCT: 24 % — ABNORMAL LOW (ref 36.0–46.0)
Hemoglobin: 7.4 g/dL — ABNORMAL LOW (ref 12.0–15.0)
Immature Granulocytes: 0 %
Lymphocytes Relative: 41 %
Lymphs Abs: 1.6 K/uL (ref 0.7–4.0)
MCH: 22.8 pg — ABNORMAL LOW (ref 26.0–34.0)
MCHC: 30.8 g/dL (ref 30.0–36.0)
MCV: 74.1 fL — ABNORMAL LOW (ref 80.0–100.0)
Monocytes Absolute: 0.3 K/uL (ref 0.1–1.0)
Monocytes Relative: 7 %
Neutro Abs: 2 K/uL (ref 1.7–7.7)
Neutrophils Relative %: 50 %
Platelet Count: 266 K/uL (ref 150–400)
RBC: 3.24 MIL/uL — ABNORMAL LOW (ref 3.87–5.11)
RDW: 21.2 % — ABNORMAL HIGH (ref 11.5–15.5)
WBC Count: 3.8 K/uL — ABNORMAL LOW (ref 4.0–10.5)
nRBC: 0 % (ref 0.0–0.2)

## 2024-02-16 LAB — CBC
HCT: 26.6 % — ABNORMAL LOW (ref 36.0–46.0)
Hemoglobin: 7.7 g/dL — ABNORMAL LOW (ref 12.0–15.0)
MCH: 21.9 pg — ABNORMAL LOW (ref 26.0–34.0)
MCHC: 28.9 g/dL — ABNORMAL LOW (ref 30.0–36.0)
MCV: 75.6 fL — ABNORMAL LOW (ref 80.0–100.0)
Platelets: 290 K/uL (ref 150–400)
RBC: 3.52 MIL/uL — ABNORMAL LOW (ref 3.87–5.11)
RDW: 21.4 % — ABNORMAL HIGH (ref 11.5–15.5)
WBC: 3.1 K/uL — ABNORMAL LOW (ref 4.0–10.5)
nRBC: 0 % (ref 0.0–0.2)

## 2024-02-16 LAB — BASIC METABOLIC PANEL WITH GFR
Anion gap: 8 (ref 5–15)
BUN: 8 mg/dL (ref 6–20)
CO2: 22 mmol/L (ref 22–32)
Calcium: 9.1 mg/dL (ref 8.9–10.3)
Chloride: 106 mmol/L (ref 98–111)
Creatinine, Ser: 0.7 mg/dL (ref 0.44–1.00)
GFR, Estimated: >60 mL/min (ref >60)
Glucose, Bld: 106 mg/dL — ABNORMAL HIGH (ref 70–99)
Potassium: 3.5 mmol/L (ref 3.5–5.1)
Sodium: 136 mmol/L (ref 135–145)

## 2024-02-16 LAB — FERRITIN: Ferritin: 47 ng/mL (ref 11–307)

## 2024-02-16 LAB — IRON AND IRON BINDING CAPACITY (CC-WL,HP ONLY)
Iron: 13 ug/dL — ABNORMAL LOW (ref 28–170)
Saturation Ratios: 3 % — ABNORMAL LOW (ref 10.4–31.8)
TIBC: 431 ug/dL (ref 250–450)
UIBC: 418 ug/dL

## 2024-02-16 LAB — PREPARE RBC (CROSSMATCH)

## 2024-02-16 MED ORDER — FOLIC ACID 1 MG PO TABS: 1.0000 mg | ORAL_TABLET | Freq: Every day | ORAL | 3 refills | Status: AC

## 2024-02-16 MED ORDER — SODIUM CHLORIDE 0.9% IV SOLUTION: Freq: Once | INTRAVENOUS | Status: DC

## 2024-02-16 NOTE — ED Notes (Signed)
 ED Provider at bedside.

## 2024-02-16 NOTE — ED Triage Notes (Signed)
 Pt sent from hematology for blood transfusion d/t hgb of 7.4, taking a trip tomorrow and [facility] is not able to get blood in time today. Endorses some dizziness.

## 2024-02-16 NOTE — ED Provider Notes (Signed)
 [place] EMERGENCY DEPARTMENT AT [facility] Provider Note   CSN: [id number] Arrival date & time: 02/16/24  1256     Patient presents with: Abnormal labs   Ashley Stewart is a 38 y.o. female with history of iron -deficiency anemia, hypertension, thalassemia presents emerged from today for evaluation for blood transfusion.  Patient was seen earlier today at heme-onc regarding her iron  deficiency anemia and was found to have a lower hemoglobin at 7.4.  Patient's baseline is around 8.4 per patient and from looking at previous charts.  Unfortunately, hematology was not able to get her in for a blood transfusion today so send her to the ER for 1 unit transfusion.  Patient is going to [place] tomorrow and needed to get this done before she was gone for the week.  She reports that she is feeling mildly lightheaded but her vaginal bleeding has significantly improved.  She is currently on a birth control pill to help with her bleeding and scheduled for hysterectomy soon.  She is not having any syncope does feel more fatigued but does not feel much different from her baseline.   HPI     Prior to Admission medications   Medication Sig Start Date End Date Taking? Authorizing Provider  ALPRAZolam (XANAX) 0.25 MG tablet Take 1 tablet by mouth as needed. 10/07/20   [provider]  amLODipine (NORVASC) 2.5 MG tablet Take 2.5 mg by mouth daily.    [provider]  folic acid  (FOLVITE ) 1 MG tablet Take 1 tablet (1 mg total) by mouth daily. 02/16/24   Tonette Lauraine HERO, PA-C  Iron -FA-B Cmp-C-Biot-Probiotic (FUSION PLUS) CAPS Take 1 capsule by mouth daily. Patient not taking: Reported on 02/16/2024 01/25/23   Tonette Lauraine HERO, PA-C  losartan  (COZAAR ) 100 MG tablet Take 100 mg by mouth daily. 07/29/23   [provider]  norethindrone (MICRONOR) 0.35 MG tablet Take 1 tablet by mouth daily. 10/14/23   [provider]  pantoprazole (PROTONIX) 40 MG tablet TAKE 1 TABLET EVERY  DAY AS NEEDED Patient not taking: Reported on 02/16/2024    [provider]  Vitamin D, Ergocalciferol, (DRISDOL) 1.25 MG (50000 UNIT) CAPS capsule Take 50,000 Units by mouth once a week. Patient not taking: Reported on 02/16/2024 09/24/19   [provider]    Allergies: Patient has no known allergies.    Review of Systems  Constitutional:  Positive for fatigue.  Respiratory:  Negative for shortness of breath.   Cardiovascular:  Negative for chest pain.  Genitourinary:  Positive for vaginal bleeding (improved).  Neurological:  Negative for syncope.    Updated Vital Signs BP (!) 149/100   Pulse 82   Temp 98.2 F (36.8 C) (Oral)   Resp 16   SpO2 100%   Physical Exam Vitals and nursing note reviewed.  Constitutional:      General: She is not in acute distress.    Appearance: She is not ill-appearing or toxic-appearing.  HENT:     Mouth/Throat:     Mouth: Mucous membranes are moist.  Eyes:     General: No scleral icterus. Pulmonary:     Effort: Pulmonary effort is normal. No respiratory distress.  Skin:    General: Skin is warm and dry.  Neurological:     General: No focal deficit present.     Mental Status: She is alert.     (all labs ordered are listed, but only abnormal results are displayed) Labs Reviewed  CBC - Abnormal; Notable for the following  components:      Result Value   WBC 3.1 (*)    RBC 3.52 (*)    Hemoglobin 7.7 (*)    HCT 26.6 (*)    MCV 75.6 (*)    MCH 21.9 (*)    MCHC 28.9 (*)    RDW 21.4 (*)    All other components within normal limits  BASIC METABOLIC PANEL WITH GFR - Abnormal; Notable for the following components:   Glucose, Bld 106 (*)    All other components within normal limits  TYPE AND SCREEN  PREPARE RBC (CROSSMATCH)    EKG: None  Radiology: No results found.  Procedures   Medications Ordered in the ED  0.9 %  sodium chloride  infusion (Manually program via Guardrails IV Fluids) (has no administration in time  range)     Medical Decision Making Amount and/or Complexity of Data Reviewed Labs: ordered.  Risk Prescription drug management.   38 y.o. female presents to the ER for evaluation of low hemoglobin. Differential diagnosis includes but is not limited to known IDA, vaginal bleeding, lab error. Vital signs mildly elevated blood pressure otherwise unremarkable. Physical exam as noted above.   On previous chart evaluation, patient was seen at hematology today.  Patient's hemoglobin was 7.4.  Could not get her into the transfusion center today but is recommending 1 unit PRBC as patient is going out of the country tomorrow.  Labs were ordered in triage.  I independently reviewed and interpreted the patient's labs.  CBC shows white blood cell count of 3.1, hemoglobin 7.7 with a hematocrit of 26.6.  Appears patient's baseline is from 8.3-8.4.  Patient is having vaginal bleeding however reports is significantly improved and is currently on her menstrual cycle.  Is scheduled for hysterectomy later.  BMP's glucose 106 otherwise unremarkable.  I discussed risk and benefits of blood transfusion with patient at bedside.  We discussed antibodies, transfusion reactions as well as possible contamination with hepatitis B/C/HIV.  Patient verbalizes understanding of the risk, like to proceed with blood transfusion.  1 unit PRBC ordered per hematology notes.  Unit transfused. Unremarkable course. She is feeling better. Recommended follow up with Heme/Onc. Stable for discharge home.  We discussed the results of the labs/imaging. The plan is follow up with heme/onc. We discussed strict return precautions and red flag symptoms. The patient verbalized their understanding and agrees to the plan. The patient is stable and being discharged home in good condition.  Portions of this report may have been transcribed using voice recognition software. Every effort was made to ensure accuracy; however, inadvertent computerized  transcription errors may be present.    Final diagnoses:  Iron  deficiency anemia due to chronic blood loss    ED Discharge Orders     None          Bernis Ernst, [place] 02/16/24 1820    Darra Fonda MATSU, MD 02/17/24 1104

## 2024-02-16 NOTE — Progress Notes (Signed)
 Hematology and Oncology Follow Up Visit  Ashley Stewart [id number] 1986-07-08 38 y.o. 02/16/2024   Principle Diagnosis:  Iron  deficiency anemia  Sickle cell trait Alpha thalassemia minor   Current Therapy:        IV iron  as indicated- last infusion was on- 01/17/2024 Folic acid  1 mg PO daily    Interim History:  Ashley Stewart is here today for follow-up.   She has been feeling fatigued. Her last menstrual cycle started about 10 days ago and is still present, though thankfully lighter now. Heavy bleeding for the first 7 days.   She has been working with GYN to help lighten her menstrual cycles. She has been having significant menstrual cycles and spotting. No other blood loss noted. No bruising or petechiae. She has a total hysterectomy planned for the end of November.   She has been treated with IV Venofer  in the past and has done well with this. Her last infusion was on 01/17/2024  She is on her folic acid  supplement  She denies fever, chills, n/v, cough, rash, dizziness, SOB, chest pain, palpitations, abdominal pain or changes in bowel or bladder habits.  No swelling, tenderness, numbness or tingling in her extremities. No falls or syncope.  She has maintained a good appetite and is doing her best to stay well hydrated.   Wt Readings from Last 3 Encounters:  02/16/24 171 lb 6.4 oz (77.7 kg)  11/24/23 174 lb 1.3 oz (79 kg)  09/27/23 177 lb (80.3 kg)     ECOG Performance Status: 1 - Symptomatic but completely ambulatory  Medications:  Allergies as of 02/16/2024   No Known Allergies      Medication List        Accurate as of February 16, 2024 12:01 PM. If you have any questions, ask your nurse or doctor.          ALPRAZolam 0.25 MG tablet Commonly known as: XANAX Take 1 tablet by mouth as needed.   amLODipine 2.5 MG tablet Commonly known as: NORVASC Take 2.5 mg by mouth daily.   folic acid  1 MG tablet Commonly known as: FOLVITE  Take 1 tablet (1 mg total) by mouth  daily.   Fusion Plus Caps Take 1 capsule by mouth daily.   losartan  100 MG tablet Commonly known as: COZAAR  Take 100 mg by mouth daily.   norethindrone 0.35 MG tablet Commonly known as: MICRONOR Take 1 tablet by mouth daily.   pantoprazole 40 MG tablet Commonly known as: PROTONIX TAKE 1 TABLET EVERY DAY AS NEEDED   Vitamin D (Ergocalciferol) 1.25 MG (50000 UNIT) Caps capsule Commonly known as: DRISDOL Take 50,000 Units by mouth once a week.        Allergies: No Known Allergies  Past Medical History, Surgical history, Social history, and Family History were reviewed and updated.  Review of Systems: All other 10 point review of systems is negative.   Physical Exam:  height is 5' 9 (1.753 m) and weight is 171 lb 6.4 oz (77.7 kg). Her oral temperature is 99.3 F (37.4 C). Her blood pressure is 109/76 and her pulse is 79. Her respiration is 18 and oxygen saturation is 100%.   Wt Readings from Last 3 Encounters:  02/16/24 171 lb 6.4 oz (77.7 kg)  11/24/23 174 lb 1.3 oz (79 kg)  09/27/23 177 lb (80.3 kg)    Ocular: Sclerae unicteric, pupils equal, round and reactive to light Ear-nose-throat: Oropharynx clear, dentition fair Lymphatic: No cervical or supraclavicular adenopathy Lungs no rales  or rhonchi, good excursion bilaterally Heart regular rate and rhythm, no murmur appreciated MSK no obvious joint edema Neuro: non-focal, well-oriented, appropriate affect  Lab Results  Component Value Date   WBC 3.8 (L) 02/16/2024   HGB 7.4 (L) 02/16/2024   HCT 24.0 (L) 02/16/2024   MCV 74.1 (L) 02/16/2024   PLT 266 02/16/2024   Lab Results  Component Value Date   FERRITIN 24 11/24/2023   IRON  25 (L) 11/24/2023   TIBC 435 11/24/2023   UIBC 410 11/24/2023   IRONPCTSAT 6 (L) 11/24/2023   Lab Results  Component Value Date   RETICCTPCT 0.9 11/24/2023   RBC 3.24 (L) 02/16/2024   No results found for: KPAFRELGTCHN, LAMBDASER, KAPLAMBRATIO No results found for:  IGGSERUM, IGA, IGMSERUM No results found for: STEPHANY CARLOTA BENSON MARKEL EARLA JOANNIE DOC VICK, SPEI   Chemistry      Component Value Date/Time   NA 137 09/27/2023 0905   K 3.3 (L) 09/27/2023 0905   CL 104 09/27/2023 0905   CO2 26 09/27/2023 0905   BUN 5 (L) 09/27/2023 0905   CREATININE 0.66 09/27/2023 0905      Component Value Date/Time   CALCIUM 8.6 (L) 09/27/2023 0905   ALKPHOS 50 09/27/2023 0905   AST 17 09/27/2023 0905   ALT 8 09/27/2023 0905   BILITOT 0.6 09/27/2023 0905     Encounter Diagnoses  Name Primary?   Iron  deficiency anemia, unspecified iron  deficiency anemia type Yes   Sickle cell trait (HCC)    Alpha thalassemia (HCC)     Impression and Plan: Ashley Stewart is a very pleasant 38 yo [place] American female with history of iron  deficiency anemia secondary to heavy cycles and both alpha thalassemia minor and sickle cell traits.  Review of her CBC shows a Hgb of 7.4 as well as an MCV of 74.1. She is symptomatic. She reports that she is going out of the country tomorrow morning.  Unfortunately we are not able to get her blood today due to time constraints and I have instead recommended the ER. Since she is still bleeding, is symptomatic, and has a Hgb of 7.4 I would recommend 1 unit of PRBC Iron  studies pending Continue folic acid   Disposition:  RTC 2 weeks lab only RTC 4 weeks APP, labs (CBC w/, retic, iron , ferritin, sample)-[place]   Lauraine CHRISTELLA Dais, PA-C 8/6/202512:01 PM

## 2024-02-16 NOTE — Discharge Instructions (Signed)
 You were seen in the ER today for evaluation of your low hemoglobin. Please make sure to follow up with your hematologist. If you have any concerns, new or worsening symptoms, please return to the the ER for re-evaluation.   Contact a doctor if: You have itching or red, swollen areas of skin (hives). You have a fever or chills. You have pain in the head, back, or chest. You feel worried or nervous (anxious). You feel weak after doing your normal activities. You have any of these problems at the insertion site: Redness, swelling, warmth, or pain. Bleeding that does not stop with pressure. Pus or a bad smell. If you received your blood transfusion in an outpatient setting, you will be told whom to contact to report any reactions. Get help right away if: You have signs of a serious reaction. This may be coming from an allergy or the body's defense system (immune system). Signs include: Trouble breathing or shortness of breath. Swelling of the face or feeling warm (flushed). A widespread rash. Dark pee (urine) or blood in the pee. Fast heartbeat. These symptoms may be an emergency. Get help right away. Call 911. Do not wait to see if the symptoms will go away. Do not drive yourself to the hospital.

## 2024-02-17 ENCOUNTER — Telehealth: Payer: Self-pay | Admitting: Medical Oncology

## 2024-02-17 ENCOUNTER — Ambulatory Visit: Payer: Self-pay | Admitting: Medical Oncology

## 2024-02-17 LAB — BPAM RBC
Blood Product Expiration Date: 202509012359
ISSUE DATE / TIME: 202508061547
Unit Type and Rh: 7300

## 2024-02-17 LAB — TYPE AND SCREEN
ABO/RH(D): B POS
Antibody Screen: NEGATIVE
Unit division: 0

## 2024-02-17 NOTE — Telephone Encounter (Signed)
 Called to schedule follow up appt per 8/6 LOS. LVM to return call for scheduling.

## 2024-02-22 ENCOUNTER — Telehealth: Payer: Self-pay | Admitting: Medical Oncology

## 2024-02-22 NOTE — Telephone Encounter (Signed)
 Called to schedule infusion per inbasket. LVM to return call for scheduling.

## 2024-03-01 ENCOUNTER — Inpatient Hospital Stay

## 2024-03-02 ENCOUNTER — Other Ambulatory Visit

## 2024-03-02 ENCOUNTER — Telehealth: Payer: Self-pay | Admitting: Medical Oncology

## 2024-03-02 DIAGNOSIS — D560 Alpha thalassemia: Secondary | ICD-10-CM

## 2024-03-02 DIAGNOSIS — D509 Iron deficiency anemia, unspecified: Secondary | ICD-10-CM | POA: Diagnosis not present

## 2024-03-02 DIAGNOSIS — D573 Sickle-cell trait: Secondary | ICD-10-CM

## 2024-03-02 LAB — CBC
HCT: 29.4 % — ABNORMAL LOW (ref 36.0–46.0)
Hemoglobin: 9.2 g/dL — ABNORMAL LOW (ref 12.0–15.0)
MCH: 22.4 pg — ABNORMAL LOW (ref 26.0–34.0)
MCHC: 31.3 g/dL (ref 30.0–36.0)
MCV: 71.5 fL — ABNORMAL LOW (ref 80.0–100.0)
Platelets: 196 K/uL (ref 150–400)
RBC: 4.11 MIL/uL (ref 3.87–5.11)
RDW: 21.9 % — ABNORMAL HIGH (ref 11.5–15.5)
WBC: 3.5 K/uL — ABNORMAL LOW (ref 4.0–10.5)
nRBC: 0 % (ref 0.0–0.2)

## 2024-03-02 LAB — SAMPLE TO BLOOD BANK

## 2024-03-02 NOTE — Telephone Encounter (Signed)
 Patient declined scheduling iron  infusions until after her labs on 03/02/24 to see if she still is needing the iron .

## 2024-03-06 ENCOUNTER — Ambulatory Visit: Payer: Self-pay | Admitting: Medical Oncology

## 2024-03-06 NOTE — Telephone Encounter (Signed)
 As noted below by Lauraine Dais, PA, I informed her tht there is significant improvement in complete blood count. I would like to hold off on IV iron  until after her next set of labs. She verbalized understanding.

## 2024-03-06 NOTE — Telephone Encounter (Signed)
-----   Message from Lauraine CHRISTELLA Dais sent at 03/06/2024  3:34 PM EDT ----- Significant improvement in complete blood count. Holding off on IV iron  until after her next set of labs ----- Message ----- From: Rebecka, Lab In [place] Sent: 03/02/2024   8:30 AM EDT To: Lauraine CHRISTELLA Dais, PA-C

## 2024-03-21 ENCOUNTER — Inpatient Hospital Stay: Admitting: Medical Oncology

## 2024-03-21 ENCOUNTER — Inpatient Hospital Stay: Attending: Hematology & Oncology

## 2024-03-21 ENCOUNTER — Other Ambulatory Visit: Payer: Self-pay | Admitting: Medical Oncology

## 2024-03-21 DIAGNOSIS — D573 Sickle-cell trait: Secondary | ICD-10-CM

## 2024-03-21 DIAGNOSIS — D560 Alpha thalassemia: Secondary | ICD-10-CM

## 2024-03-21 DIAGNOSIS — D509 Iron deficiency anemia, unspecified: Secondary | ICD-10-CM

## 2024-03-23 ENCOUNTER — Telehealth: Payer: Self-pay | Admitting: Medical Oncology

## 2024-03-23 NOTE — Telephone Encounter (Signed)
 LVM to return call for rescheduling missed ov on 03/21/24.

## 2024-03-28 ENCOUNTER — Telehealth: Payer: Self-pay | Admitting: Medical Oncology

## 2024-03-28 NOTE — Telephone Encounter (Signed)
 Lvm for patient to return call for r/s missed ov on 03/21/24.

## 2024-04-04 ENCOUNTER — Telehealth: Payer: Self-pay | Admitting: Medical Oncology

## 2024-04-04 NOTE — Telephone Encounter (Signed)
 Lvm for patient to return call for re-schedluing missed appt in September.

## 2024-04-20 ENCOUNTER — Encounter (HOSPITAL_COMMUNITY): Payer: Self-pay | Admitting: Obstetrics and Gynecology

## 2024-04-20 NOTE — Pre-Procedure Instructions (Signed)
 Surgical Instructions  Your procedure is scheduled on :  Tuesday,  05-02-2024 Report to [facility] Main Entrance A at 8:30  AM, then check in the Admitting office. Any questions or running late day of surgery :  call [phone]  Questions prior to your surgery day:  call [phone], Monday -- Friday 8am - 4pm. If you experience any cold or flu symptoms such as cough, fever, chills, shortness of breath, etc. between now and you scheduled surgery, please notify your surgeon office.   Remember: Do not eat any food after midnight the night before surgery. You may have clear liquids from midnight night before surgery until 7:30 AM.   Clear liquids allowed are:  Water             Carbonated Beverages (diabetics choose diet or no sugar options)  Clear Tea ( no milk, no honey, etc.)  Black coffee ( NO MILK, CREAM OR POWDERED CREAMER OF ANY KIND)  Sport drinks, like Gatorade (diabetes choose diet or no sugar options)  NO clear liquid after 7:30 AM day of surgery.  This includes No water,  candy,  gum, and mints.  Take these medicines the morning of surgery with A SIPS OF WATER:   Amlodipine (norvasc)  May take these medicines IF NEEDED:     One week prior to surgery, STOP taking any Aspirin (unless otherwise instructed by your surgeon) Aleve, Naproxen, ibuprofen , Motrin , Advil , Goody's, BC's, all herbal medications/ supplements, fish oil, and non-prescription vitamins.  Do NOT Smoke (tobacco/ vaping) and Do Not drink alcohol for 24 hours prior to your procedure.  For those patients that use a CPAP.  Please bring your CPAP/ mask/ tubing with them day of surgery . Anesthesia may ask recovery room nurse to use and if you stay the night you be asked to use it.  You will be asked to removed any contacts, glasses, piercing's, hearing aid's, dentures/ partials prior to surgery.  Please bring cases/ container/ solution/ etc., for them day of surgery.   Patients discharged the day of surgery will  NOT be allowed to drive home.  You must have responsible driver and caregiver to stay at home with you the next 24 hours.  SURGICAL WAITING ROOM VISITATION Patients may have no more than 2 support people in the waiting area - if more than 2 , these visitors may rotate.  Pre-op nurse will coordinate an appropriate time for 1 Adult support person, who may not rotate, to accompany patient in pre-op.  Aware some patients may have certain circumstances, speak to pre-op nurse day of surgery.  Children under the age 77 must have an adult with them who is not the patient and must remain in the main waiting area with an adult.  If the patient needs to stay at the hospital during part of their recovery, the visitor guidelines for inpatient rooms apply.  Please refer to the [facility] website for the visitor guidelines for any additional information.  If you received a COVID test during your pre-op visit it is requested that you wear a mask when out in public, stay away from anyone that may not be feeling well and notify your surgeon if you develop symptoms.  If you have been in contact with anyone that has tested positive in the past 10 days notify your surgeon.     [place] - Preparing for Surgery  Before surgery, you can play an important role. Because skin is not sterile, it needs to be  as free of germs as possible. You can reduce the number of germs on your skin by washing with CHG (chlorhexidine gluconate) soap before surgery. CHG is an antiseptic cleaner which kills germs and bonds with the skin to continue killing germs even after washing. Oral hygiene is also important in reducing the risk of infection. Remember to brush your teeth with your regular toothpaste the morning of surgery.  Please DO NOT use if you have an allergy to CHG or antibacterial soaps. If your skin becomes reddened/irritated stop using the CHG and inform your Pre-op nurse day of surgery.  DO NOT shave (including legs and  genital area) for at least 48 hours prior to your CHG shower.   Please follow these instructions carefully:  Shower with CHG soap the night before surgery. If you choose to wash your hair, wash your hair first as usual with your normal shampoo. After you shampoo, rinse your hair and body thoroughly to remove the shampoo. Use CHG as you would any other liquid soap. You can apply CHG directly to the skin and wash gently with a clean washcloth or shower sponge. Apply the CHG soap to your body ONLY FROM THE NECK DOWN. Do not use on open wounds or open sores. Avoid contact with your eyes, ears, mouth, and genitals (private parts). Wash genitals (private parts) with your normal soap. Wash thoroughly, paying special attention to the area where your surgery will be performed. Thoroughly rinse your body with warm water from the neck down. DO NOT shower/wash with your normal soap after using and rinsing off the CHG soap. DO NOT use lotions, oils, etc., after showering with CHG. Pat yourself dry with a clean towel. Wear clean pajamas. Place clean sheets on your bed the night of your CHG shower and do not sleep with pets.  Day of Surgery  DO NOT Apply any lotions,  powder,  oils,  deodorants (may use underarm deodorant),  cologne/  perfumes  or makeup Do Not wear jewelry /  piercing's/  metal/  permanent jewelry must be removed prior to arrival day of surgery. (No plastic piercing) Do Not wear nail polish,  gel polish,  artificial nails, or any other type of covering on natural finger nails (toe nails are okay) Remember to brush your teeth and rinse mouth out. Put on clean / comfortable clothes. [place] is not responsible for valuables/ personal belongings

## 2024-04-20 NOTE — Progress Notes (Addendum)
 Addendum:   Pt came for PAT lab appointment today.  Had critical Hg 6.6.  Pt at lab appointment had stated that she had an appointment today to received blood transfusion.  Called Dr Rosalva via cell phone,  Dr Rosalva stated she already patient set up for admission to receive two units of blood for transfusion today.  T&S will need to be re-entered & done dos.   Spoke w/ via phone for pre-op interview--- pt Lab needs dos----    upt    (per anes) Lab results------ lab appt 05-01-2024 @ 0930 getting CBC/ BMP/ T&S/ EKG (per anes) COVID test -----patient states asymptomatic no test needed Arrive at ------- 0830 on 05-02-2024 NPO after MN NO Solid Food.  Clear liquids from MN until--- 0730 Pre-Surgery Ensure or G2: n/a  Med rec completed Medications to take morning of surgery ----- norvasc Diabetic medication ----- n/a  GLP1 agonist last dose: n/a GLP1 instructions:  Patient instructed no nail polish to be worn day of surgery Patient instructed to bring photo id and insurance card day of surgery Patient aware to have Driver (ride ) / caregiver    for 24 hours after surgery - husband, Ashley Stewart Patient Special Instructions ----- will pick up soap and written instructions at lab appt Pre-Op special Instructions ----- sent inbox message in epic to Dr Rosalva on 04-19-2024, requested pre-op orders  Patient verbalized understanding of instructions that were given at this phone interview. Patient denies chest pain, sob, fever, cough at the interview.

## 2024-04-21 ENCOUNTER — Encounter (HOSPITAL_COMMUNITY): Payer: Self-pay | Admitting: Obstetrics and Gynecology

## 2024-04-30 ENCOUNTER — Other Ambulatory Visit: Payer: Self-pay | Admitting: Obstetrics and Gynecology

## 2024-04-30 DIAGNOSIS — N920 Excessive and frequent menstruation with regular cycle: Secondary | ICD-10-CM

## 2024-04-30 NOTE — H&P (Deleted)
   The note originally documented on this encounter has been moved the the encounter in which it belongs.

## 2024-04-30 NOTE — H&P (Signed)
 Reason for Appointment   1.  Preop vt/ heavy menses and fibroids History of Present Illness    General:  38 y/o presents for pre-op visit. Pt is schedule for a robotic assisted laparoscopic hysterectomy with bilateral salpingectomy on 05/02/2024 for the management of menorrhagia and fibroids.   IN REVIEW:  TVUS on 12/01/2023 notable for uterus measuring 9.34 x 6.86 x 6.9 cm. 5 uterine fibroids observed: 1) Anterior 2.2 cm 2) Posterior 2.5 cm 3) Fundal 2.4 cm 4) Posterior right 2.5 cm 5) Midbody submucosal 2.6 cm Majority of midbody submucosal fibroid appears to be within endometrial cavity. Endometrial thickness with fibroid is 2.2 cm; endometrial thickness just prior to fibroid is 0.8 cm. Right ovary simple 2 cm follicle, avascular. Left ovary simple 1.7 cm follicle, avascular. No adnexal mass seen. --- She has been experiencing chronic iron  deficiency due to menorrhagia, necessitating iron  infusions. Despite the use of birth control, she continues to experience heavy menstrual bleeding, requiring a change of tampon or pad every 1.5 to 2 hours, with instances of bleeding through her clothes. . She has a history of using Mirena, which was removed due to suspected blood pressure elevation. A subsequent attempt to use Mirena resulted in two instances of expulsion.  GYNECOLOGICAL HISTORY: - Frequency and flow: Heavy menstrual bleeding, requiring change of tampon or pad every 1.5 to 2 hours - Menstrual pain: Severe cramping on the first two days of her cycle.  Current Medications Taking amLODIPine Besylate 2.5 MG Tablet TAKE 1 TABLET BY MOUTH EVERY DAY FOR HIGH BLOOD PRESSURE Losartan  Potassium 100 MG Tablet TAKE 1 TABLET BY MOUTH EVERY DAY Norethindrone 0.35 MG Tablet 1 tablet Orally Once a day ALPRAZolam 0.25 MG Tablet 1 tablet Orally Three times a day as needed for anxiety , Notes to Pharmacist: prn Folic Acid  1 MG Tablet 1 tablet Orally Once a day Medication List reviewed and reconciled  with the patient Past Medical History Hypertension. Anxiety: h/o Panic attack x 1. Anemia (secondary to heavy cycles and sickle cell trait)- seeing Dr. Raina required blood transfusions. Sickle Cell trait. Alpha Thalassemia (Ennever). Surgical History Denies Past Surgical History Family History Father: alive 56 yrs, diagnosed with Prostate CA, Diabetes Mother: alive 61 yrs, HTN, brain aneurysm Paternal Grand Father: deceased Paternal Grand Mother: deceased Maternal Grand Father: deceased, AODM Maternal Grand Mother: deceased, HTN, stroke and died at age 44, AODM Brother 1: alive 24 yrs Brother2: alive 11 yrs Sister 1: alive 50 yrs Sister 2: alive 75 yrs, diagnosed with Hypertension Sister 3: alive 31 yrs 2 brother(s) , 3 sister(s) . 2 daughter(s) - healthy. denies any GYN family cancer hx. Social History    General:  Tobacco use cigarettes: Never smoked, Tobacco history last updated 04/18/2024, Vaping No. EXPOSURE TO PASSIVE SMOKE: no. Alcohol: yes, social. Caffeine: yes, rare. Recreational drug use: no. Exercise: intermittent. DENTAL CARE: good. Marital Status: married. Children: 2, daughter (s). OCCUPATION: employed, [employer]. COMMUNICATION BARRIERS: none. Gyn History Sexual activity currently sexually active.  Periods : Twice a month.  LMP Mid- Sept.  Birth control Norethindrone.  Last pap smear date 06/11/22 NILM, HRHPV neg.  Last mammogram date 07/19/2023.  Denies H/O Abnormal pap smear.  Denies H/O STD.  Menarche 11.  OB History Number of pregnancies  2.  Pregnancy # 1  live birth, vaginal delivery, girl.  Pregnancy # 2  live birth, vaginal delivery, girl.  Allergies N.K.D.A. Hospitalization/Major Diagnostic Procedure Childbirth (2010, 2013) Review of Systems    CONSTITUTIONAL:  Chills  No. Fatigue No. Fever No. Night sweats No. Recent travel outside [place]  No. Sweats No. Weight change No.     OPHTHALMOLOGY:  Blurring of vision no. Change in vision  no. Double vision no.     ENT:  Dizziness no. Nose bleeds no. Sore throat no. Teeth pain no.     ALLERGY:  Hives no.     CARDIOLOGY:  Chest pain no. High blood pressure no. Irregular heart beat no. Leg edema no. Palpitations no.     RESPIRATORY:  Shortness of breath no. Cough no. Wheezing no.     UROLOGY:  Pain with urination no. Urinary urgency no. Urinary frequency no. Urinary incontinence no. Difficulty urinating No. Blood in urine No.     GASTROENTEROLOGY:  Abdominal pain no. Appetite change no. Bloating/belching no. Blood in stool or on toilet paper no. Change in bowel movements no. Constipation no. Diarrhea no. Difficulty swallowing no. Nausea no.     FEMALE REPRODUCTIVE:  Vulvar pain no. Vulvar rash no. Abnormal vaginal bleeding , heavy menses. Breast pain no. Nipple discharge no. Pain with intercourse no. Pelvic pain  worse with menstruation. Unusual vaginal discharge no. Vaginal itching no.     MUSCULOSKELETAL:  Muscle aches no.     NEUROLOGY:  Headache no. Tingling/numbness no. Weakness no.     PSYCHOLOGY:  Depression no. Anxiety no. Nervousness no. Sleep disturbances no. Suicidal ideation no .     ENDOCRINOLOGY:  Excessive thirst no. Excessive urination no. Hair loss no. Heat or cold intolerance no.     HEMATOLOGY/LYMPH:  Abnormal bleeding no. Easy bruising no. Swollen glands no.     DERMATOLOGY:  New/changing skin lesion no. Rash no. Sores no.  Vital Signs Wt: 173.6, Wt change: -0.6 lbs, Ht: 68.5, BMI: 26.01, Pulse sitting: 82, BP sitting: 119/78. Examination    General Examination: CONSTITUTIONAL: alert, oriented, NAD.  SKIN:  moist, warm.  EYES:  Conjunctiva clear.  LUNGS: good I:E efffort , clear to auscultation bilaterally.  HEART:  regular rate and rhythm.  ABDOMEN: soft, non-tender/non-distended, bowel sounds present.  FEMALE GENITOURINARY: normal external genitalia, labia - unremarkable, vagina - pink moist mucosa, no lesions or abnormal discharge, cervix - no  discharge or lesions or CMT, adnexa - no masses or tenderness, uterus - nontender and normal size on palpation.  EXTREMITIES: no edema present.  PSYCH:  affect normal, good eye contact.  Physical Examination    Chaperone present:  Chaperone present Francoise Ehrich 04/18/2024 04:19:42 PM >, for pelvic exam.  Assessments 1. Menorrhagia with regular cycle - N92.0 (Primary)    2. Fibroids - D25.9    3. Chronic iron  deficiency anemia - D50.9    Treatment 1. Menorrhagia with regular cycle      LAB: CBC without Diff (Collection Date & Time - 04/18/2024 04:35 PM)     Notes: Planning robotic assisted laparocopic hysterectomy with bilateral salpingectomy. Pt advised she will stay overnight, or 2 days if conversion to larger incision. She is advised that in order to be discharged from hospital, she will need to be able to ambulate, urinate, tolerate food, and take pain medication by mouth. Discussed risks of hysterectomy including but not limited to infection, bleeding, conversion to larger incision, damage to her bowel, bladder, or ureters, with the need for further surgery. Discussed risk of blood transfusion and risk of HIV or hep B&C with blood transfusion. Pt is aware of risks and desires blood transfusion if needed. Pt advised to avoid NSAIDs (Aspirin, Aleve, Advil , Ibuprofen , Motrin )  from now until surgery given risk of bleeding during surgery. She may take Tylenol  for pain management. She is advised to avoid eating or drinking starting midnight prior to surgery. Discussed post-surgery avoidance of driving for 1 week and avoidance of lifting weight greater than 10 lbs or intercourse for 6-8 weeks after procedure.   2. Fibroids        Notes: Planning robotic assisted laparocopic hysterectomy with bilateral salpingectomy. Pt advised she will stay overnight, or 2 days if conversion to larger incision. She is advised that in order to be discharged from hospital, she will need to be able to ambulate, urinate,  tolerate food, and take pain medication by mouth. Discussed risks of hysterectomy including but not limited to infection, bleeding, conversion to larger incision, damage to her bowel, bladder, or ureters, with the need for further surgery. Discussed risk of blood transfusion and risk of HIV or hep B&C with blood transfusion. Pt is aware of risks and desires blood transfusion if needed. Pt advised to avoid NSAIDs (Aspirin, Aleve, Advil , Ibuprofen , Motrin ) from now until surgery given risk of bleeding during surgery. She may take Tylenol  for pain management. She is advised to avoid eating or drinking starting midnight prior to surgery. Discussed post-surgery avoidance of driving for 1 week and avoidance of lifting weight greater than 10 lbs or intercourse for 6-8 weeks after procedure.   3. Chronic iron  deficiency anemia        Notes: Planning robotic assisted laparocopic hysterectomy with bilateral salpingectomy. Pt advised she will stay overnight, or 2 days if conversion to larger incision. She is advised that in order to be discharged from hospital, she will need to be able to ambulate, urinate, tolerate food, and take pain medication by mouth. Discussed risks of hysterectomy including but not limited to infection, bleeding, conversion to larger incision, damage to her bowel, bladder, or ureters, with the need for further surgery. Discussed risk of blood transfusion and risk of HIV or hep B&C with blood transfusion. Pt is aware of risks and desires blood transfusion if needed. Pt advised to avoid NSAIDs (Aspirin, Aleve, Advil , Ibuprofen , Motrin ) from now until surgery given risk of bleeding during surgery. She may take Tylenol  for pain management. She is advised to avoid eating or drinking starting midnight prior to surgery. Discussed post-surgery avoidance of driving for 1 week and avoidance of lifting weight greater than 10 lbs or intercourse for 6-8 weeks after procedure.

## 2024-05-01 ENCOUNTER — Observation Stay (HOSPITAL_COMMUNITY)
Admission: AD | Admit: 2024-05-01 | Discharge: 2024-05-01 | Disposition: A | Discharge status: Home / Self Care | Attending: Obstetrics and Gynecology | Admitting: Obstetrics and Gynecology

## 2024-05-01 ENCOUNTER — Other Ambulatory Visit: Payer: Self-pay | Admitting: Obstetrics and Gynecology

## 2024-05-01 ENCOUNTER — Encounter (HOSPITAL_COMMUNITY)
Admission: RE | Admit: 2024-05-01 | Discharge: 2024-05-01 | Disposition: A | Discharge status: Home / Self Care | Source: Ambulatory Visit | Attending: Obstetrics and Gynecology | Admitting: Obstetrics and Gynecology

## 2024-05-01 DIAGNOSIS — Z01818 Encounter for other preprocedural examination: Secondary | ICD-10-CM | POA: Insufficient documentation

## 2024-05-01 DIAGNOSIS — D259 Leiomyoma of uterus, unspecified: Secondary | ICD-10-CM | POA: Insufficient documentation

## 2024-05-01 DIAGNOSIS — D500 Iron deficiency anemia secondary to blood loss (chronic): Secondary | ICD-10-CM | POA: Diagnosis not present

## 2024-05-01 DIAGNOSIS — Z79899 Other long term (current) drug therapy: Secondary | ICD-10-CM | POA: Diagnosis not present

## 2024-05-01 DIAGNOSIS — N920 Excessive and frequent menstruation with regular cycle: Secondary | ICD-10-CM | POA: Insufficient documentation

## 2024-05-01 LAB — CBC
HCT: 23.1 % — ABNORMAL LOW (ref 36.0–46.0)
HCT: 23.9 % — ABNORMAL LOW (ref 36.0–46.0)
Hemoglobin: 6.6 g/dL — CL (ref 12.0–15.0)
Hemoglobin: 6.8 g/dL — CL (ref 12.0–15.0)
MCH: 18.6 pg — ABNORMAL LOW (ref 26.0–34.0)
MCH: 18.3 pg — ABNORMAL LOW (ref 26.0–34.0)
MCHC: 28.6 g/dL — ABNORMAL LOW (ref 30.0–36.0)
MCHC: 28.5 g/dL — ABNORMAL LOW (ref 30.0–36.0)
MCV: 65.1 fL — ABNORMAL LOW (ref 80.0–100.0)
MCV: 64.4 fL — ABNORMAL LOW (ref 80.0–100.0)
Platelets: 296 K/uL (ref 150–400)
Platelets: 305 K/uL (ref 150–400)
RBC: 3.55 MIL/uL — ABNORMAL LOW (ref 3.87–5.11)
RBC: 3.71 MIL/uL — ABNORMAL LOW (ref 3.87–5.11)
RDW: 20.5 % — ABNORMAL HIGH (ref 11.5–15.5)
RDW: 20.3 % — ABNORMAL HIGH (ref 11.5–15.5)
WBC: 2.4 K/uL — ABNORMAL LOW (ref 4.0–10.5)
WBC: 2.7 K/uL — ABNORMAL LOW (ref 4.0–10.5)
nRBC: 0 % (ref 0.0–0.2)
nRBC: 0 % (ref 0.0–0.2)

## 2024-05-01 LAB — HEMOGLOBIN AND HEMATOCRIT, BLOOD
HCT: 29.9 % — ABNORMAL LOW (ref 36.0–46.0)
Hemoglobin: 9 g/dL — ABNORMAL LOW (ref 12.0–15.0)

## 2024-05-01 LAB — BASIC METABOLIC PANEL WITH GFR
Anion gap: 7 (ref 5–15)
BUN: 6 mg/dL (ref 6–20)
CO2: 24 mmol/L (ref 22–32)
Calcium: 8.6 mg/dL — ABNORMAL LOW (ref 8.9–10.3)
Chloride: 109 mmol/L (ref 98–111)
Creatinine, Ser: 0.68 mg/dL (ref 0.44–1.00)
GFR, Estimated: >60 mL/min (ref >60)
Glucose, Bld: 97 mg/dL (ref 70–99)
Potassium: 3.7 mmol/L (ref 3.5–5.1)
Sodium: 140 mmol/L (ref 135–145)

## 2024-05-01 LAB — PREPARE RBC (CROSSMATCH)

## 2024-05-01 MED ORDER — SODIUM CHLORIDE 0.9 % IV SOLN: 250.0000 mL | INTRAVENOUS | Status: AC | PRN

## 2024-05-01 MED ORDER — ACETAMINOPHEN 325 MG PO TABS
650.0000 mg | ORAL_TABLET | Freq: Once | ORAL | Status: AC
  Administered 2024-05-01: 650 mg via ORAL
  Filled 2024-05-01: qty 2

## 2024-05-01 MED ORDER — ALUM & MAG HYDROXIDE-SIMETH 200-200-20 MG/5ML PO SUSP: 30.0000 mL | ORAL | Status: DC | PRN

## 2024-05-01 MED ORDER — SIMETHICONE 80 MG PO CHEW: 80.0000 mg | CHEWABLE_TABLET | Freq: Four times a day (QID) | ORAL | Status: DC | PRN

## 2024-05-01 MED ORDER — SODIUM CHLORIDE 0.9% IV SOLUTION: Freq: Once | INTRAVENOUS | Status: AC

## 2024-05-01 MED ORDER — ONDANSETRON HCL 4 MG/2ML IJ SOLN: 4.0000 mg | Freq: Four times a day (QID) | INTRAMUSCULAR | Status: DC | PRN

## 2024-05-01 MED ORDER — ONDANSETRON HCL 4 MG PO TABS: 4.0000 mg | ORAL_TABLET | Freq: Four times a day (QID) | ORAL | Status: DC | PRN

## 2024-05-01 MED ORDER — SODIUM CHLORIDE 0.9% FLUSH: 3.0000 mL | INTRAVENOUS | Status: DC | PRN

## 2024-05-01 MED ORDER — OXYCODONE-ACETAMINOPHEN 5-325 MG PO TABS: 1.0000 | ORAL_TABLET | ORAL | Status: DC | PRN

## 2024-05-01 MED ORDER — FUROSEMIDE 10 MG/ML IJ SOLN
20.0000 mg | Freq: Once | INTRAMUSCULAR | Status: AC
  Administered 2024-05-01: 20 mg via INTRAVENOUS
  Filled 2024-05-01: qty 2

## 2024-05-01 MED ORDER — SODIUM CHLORIDE 0.9% FLUSH
3.0000 mL | Freq: Two times a day (BID) | INTRAVENOUS | Status: DC
  Administered 2024-05-01: 3 mL via INTRAVENOUS

## 2024-05-01 MED ORDER — DIPHENHYDRAMINE HCL 25 MG PO CAPS
25.0000 mg | ORAL_CAPSULE | Freq: Once | ORAL | Status: AC
  Administered 2024-05-01: 25 mg via ORAL
  Filled 2024-05-01: qty 1

## 2024-05-01 NOTE — H&P (Signed)
 Reason for Appointment   Iron  deficiency anemia due to chronic blood loss  History of Present Illness    General:  38 y/o presents for  blood transfusion in preparation for a robotic assisted laparoscopic hysterectomy with bilateral salpingectomy on 05/02/2024 for the management of menorrhagia and fibroids.  She has anemia and her hemoglobin today was 6.6.   IN REVIEW:  TVUS on 12/01/2023 notable for uterus measuring 9.34 x 6.86 x 6.9 cm. 5 uterine fibroids observed: 1) Anterior 2.2 cm 2) Posterior 2.5 cm 3) Fundal 2.4 cm 4) Posterior right 2.5 cm 5) Midbody submucosal 2.6 cm Majority of midbody submucosal fibroid appears to be within endometrial cavity. Endometrial thickness with fibroid is 2.2 cm; endometrial thickness just prior to fibroid is 0.8 cm. Right ovary simple 2 cm follicle, avascular. Left ovary simple 1.7 cm follicle, avascular. No adnexal mass seen. --- She has been experiencing chronic iron  deficiency due to menorrhagia, necessitating iron  infusions. Despite the use of birth control, she continues to experience heavy menstrual bleeding, requiring a change of tampon or pad every 1.5 to 2 hours, with instances of bleeding through her clothes. . She has a history of using Mirena, which was removed due to suspected blood pressure elevation. A subsequent attempt to use Mirena resulted in two instances of expulsion.   GYNECOLOGICAL HISTORY: - Frequency and flow: Heavy menstrual bleeding, requiring change of tampon or pad every 1.5 to 2 hours - Menstrual pain: Severe cramping on the first two days of her cycle.   Current Medications Taking amLODIPine Besylate 2.5 MG Tablet TAKE 1 TABLET BY MOUTH EVERY DAY FOR HIGH BLOOD PRESSURE Losartan  Potassium 100 MG Tablet TAKE 1 TABLET BY MOUTH EVERY DAY Norethindrone 0.35 MG Tablet 1 tablet Orally Once a day ALPRAZolam 0.25 MG Tablet 1 tablet Orally Three times a day as needed for anxiety , Notes to Pharmacist: prn Folic Acid  1 MG Tablet 1  tablet Orally Once a day Medication List reviewed and reconciled with the patient Past Medical History Hypertension. Anxiety: h/o Panic attack x 1. Anemia (secondary to heavy cycles and sickle cell trait)- seeing Dr. Raina required blood transfusions. Sickle Cell trait. Alpha Thalassemia (Ennever). Surgical History Denies Past Surgical History Family History Father: alive 31 yrs, diagnosed with Prostate CA, Diabetes Mother: alive 92 yrs, HTN, brain aneurysm Paternal Grand Father: deceased Paternal Grand Mother: deceased Maternal Grand Father: deceased, AODM Maternal Grand Mother: deceased, HTN, stroke and died at age 5, AODM Brother 1: alive 72 yrs Brother2: alive 82 yrs Sister 1: alive 81 yrs Sister 2: alive 74 yrs, diagnosed with Hypertension Sister 3: alive 31 yrs 2 brother(s) , 3 sister(s) . 2 daughter(s) - healthy. denies any GYN family cancer hx. Social History    General:  Tobacco use cigarettes: Never smoked, Tobacco history last updated 04/18/2024, Vaping No. EXPOSURE TO PASSIVE SMOKE: no. Alcohol: yes, social. Caffeine: yes, rare. Recreational drug use: no. Exercise: intermittent. DENTAL CARE: good. Marital Status: married. Children: 2, daughter (s). OCCUPATION: employed, [employer]. COMMUNICATION BARRIERS: none. Gyn History Sexual activity currently sexually active.  Periods : Twice a month.  LMP Mid- Sept.  Birth control Norethindrone.  Last pap smear date 06/11/22 NILM, HRHPV neg.  Last mammogram date 07/19/2023.  Denies H/O Abnormal pap smear.  Denies H/O STD.  Menarche 11.  OB History Number of pregnancies  2.  Pregnancy # 1  live birth, vaginal delivery, girl.  Pregnancy # 2  live birth, vaginal delivery, girl.  Allergies N.K.D.A. Hospitalization/Major Diagnostic  Procedure Childbirth (2010, 2013) Review of Systems    CONSTITUTIONAL:  Chills No. Fatigue No. Fever No. Night sweats No. Recent travel outside [place]  No. Sweats No. Weight change  No.     OPHTHALMOLOGY:  Blurring of vision no. Change in vision no. Double vision no.     ENT:  Dizziness no. Nose bleeds no. Sore throat no. Teeth pain no.     ALLERGY:  Hives no.     CARDIOLOGY:  Chest pain no. High blood pressure no. Irregular heart beat no. Leg edema no. Palpitations no.     RESPIRATORY:  Shortness of breath no. Cough no. Wheezing no.     UROLOGY:  Pain with urination no. Urinary urgency no. Urinary frequency no. Urinary incontinence no. Difficulty urinating No. Blood in urine No.     GASTROENTEROLOGY:  Abdominal pain no. Appetite change no. Bloating/belching no. Blood in stool or on toilet paper no. Change in bowel movements no. Constipation no. Diarrhea no. Difficulty swallowing no. Nausea no.     FEMALE REPRODUCTIVE:  Vulvar pain no. Vulvar rash no. Abnormal vaginal bleeding , heavy menses. Breast pain no. Nipple discharge no. Pain with intercourse no. Pelvic pain  worse with menstruation. Unusual vaginal discharge no. Vaginal itching no.     MUSCULOSKELETAL:  Muscle aches no.     NEUROLOGY:  Headache no. Tingling/numbness no. Weakness no.     PSYCHOLOGY:  Depression no. Anxiety no. Nervousness no. Sleep disturbances no. Suicidal ideation no .     ENDOCRINOLOGY:  Excessive thirst no. Excessive urination no. Hair loss no. Heat or cold intolerance no.     HEMATOLOGY/LYMPH:  Abnormal bleeding no. Easy bruising no. Swollen glands no.     DERMATOLOGY:  New/changing skin lesion no. Rash no. Sores no.  Vital Signs Wt: 173.6, Wt change: -0.6 lbs, Ht: 68.5, BMI: 26.01, Pulse sitting: 82, BP sitting: 119/78. Examination    General Examination: CONSTITUTIONAL: alert, oriented, NAD.  SKIN:  moist, warm.  EYES:  Conjunctiva clear.  LUNGS: good I:E efffort , clear to auscultation bilaterally.  HEART:  regular rate and rhythm.  ABDOMEN: soft, non-tender/non-distended, bowel sounds present.  EXTREMITIES: no edema present.  PSYCH:  affect normal, good eye contact.     Results for orders placed or performed during the hospital encounter of 05/01/24 (from the past 24 hours)  Type and screen     Status: None (Preliminary result)   Collection Time: 05/01/24 11:06 AM  Result Value Ref Range   ABO/RH(D) B POS    Antibody Screen NEG    Sample Expiration 05/04/2024,2359    Unit Number [id number]    Blood Component Type RED CELLS,LR    Unit division 00    Status of Unit ISSUED    Transfusion Status OK TO TRANSFUSE    Crossmatch Result      Compatible Performed at [facility], [address], [place], [place] [address]    Unit Number [id number]    Blood Component Type RED CELLS,LR    Unit division 00    Status of Unit ISSUED    Transfusion Status OK TO TRANSFUSE    Crossmatch Result Compatible   CBC     Status: Abnormal   Collection Time: 05/01/24 11:11 AM  Result Value Ref Range   WBC 2.7 (L) 4.0 - 10.5 K/uL   RBC 3.71 (L) 3.87 - 5.11 MIL/uL   Hemoglobin 6.8 (LL) 12.0 - 15.0 g/dL   HCT 76.0 (L) 63.9 - 53.9 %  MCV 64.4 (L) 80.0 - 100.0 fL   MCH 18.3 (L) 26.0 - 34.0 pg   MCHC 28.5 (L) 30.0 - 36.0 g/dL   RDW 79.6 (H) 88.4 - 84.4 %   Platelets 305 150 - 400 K/uL   nRBC 0.0 0.0 - 0.2 %  Prepare RBC (crossmatch)     Status: None   Collection Time: 05/01/24 11:11 AM  Result Value Ref Range   Order Confirmation      ORDER PROCESSED BY BLOOD BANK Performed at [facility], [address] [place], [place] [address]     Assessments 1.         Menorrhagia with regular cycle - N92.0 (Primary)    2.         Fibroids - D25.9    3.         Chronic iron  deficiency anemia - D50.9      Treatment 1.Menorrhagia with regular cycle                                         Notes: Planning robotic assisted laparocopic hysterectomy with bilateral salpingectomy. Pt advised she will stay overnight, or 2 days if conversion to larger incision. She is advised that in order to be discharged from hospital, she will need to be able to  ambulate, urinate, tolerate food, and take pain medication by mouth. Discussed risks of hysterectomy including but not limited to infection, bleeding, conversion to larger incision, damage to her bowel, bladder, or ureters, with the need for further surgery. Discussed risk of blood transfusion and risk of HIV or hep B&C with blood transfusion. Pt is aware of risks and desires blood transfusion if needed. Pt advised to avoid NSAIDs (Aspirin, Aleve, Advil , Ibuprofen , Motrin ) from now until surgery given risk of bleeding during surgery. She may take Tylenol  for pain management. She is advised to avoid eating or drinking starting midnight prior to surgery. Discussed post-surgery avoidance of driving for 1 week and avoidance of lifting weight greater than 10 lbs or intercourse for 6-8 weeks after procedure.   2.Fibroids                            Notes: Planning robotic assisted laparocopic hysterectomy with bilateral salpingectomy. Pt advised she will stay overnight, or 2 days if conversion to larger incision. She is advised that in order to be discharged from hospital, she will need to be able to ambulate, urinate, tolerate food, and take pain medication by mouth. Discussed risks of hysterectomy including but not limited to infection, bleeding, conversion to larger incision, damage to her bowel, bladder, or ureters, with the need for further surgery. Discussed risk of blood transfusion and risk of HIV or hep B&C with blood transfusion. Pt is aware of risks and desires blood transfusion if needed. Pt advised to avoid NSAIDs (Aspirin, Aleve, Advil , Ibuprofen , Motrin ) from now until surgery given risk of bleeding during surgery. She may take Tylenol  for pain management. She is advised to avoid eating or drinking starting midnight prior to surgery. Discussed post-surgery avoidance of driving for 1 week and avoidance of lifting weight greater than 10 lbs or intercourse for 6-8 weeks after procedure.    3.Chronic iron   deficiency anemia- Admit for 2 units of pRBC's  Notes: Planning robotic assisted laparocopic hysterectomy with bilateral salpingectomy. Pt advised she will stay overnight, or 2 days if conversion to larger incision. She is advised that in order to be discharged from hospital, she will need to be able to ambulate, urinate, tolerate food, and take pain medication by mouth. Discussed risks of hysterectomy including but not limited to infection, bleeding, conversion to larger incision, damage to her bowel, bladder, or ureters, with the need for further surgery. Discussed risk of blood transfusion and risk of HIV or hep B&C with blood transfusion. Pt is aware of risks and desires blood transfusion if needed. Pt advised to avoid NSAIDs (Aspirin, Aleve, Advil , Ibuprofen , Motrin ) from now until surgery given risk of bleeding during surgery. She may take Tylenol  for pain management. She is advised to avoid eating or drinking starting midnight prior to surgery. Discussed post-surgery avoidance of driving for 1 week and avoidance of lifting weight greater than 10 lbs or intercourse for 6-8 weeks after procedure.

## 2024-05-01 NOTE — Discharge Summary (Signed)
 Physician Discharge Summary  Patient ID: Ashley Stewart MRN: [id number] DOB/AGE: 1986/06/11 38 y.o.  Admit date: 05/01/2024 Discharge date: 05/01/2024  Admission Diagnoses: 1) Iron  deficiency anemia due to chronic blood loss 2) Menorrhagia 3) Fibroids   Discharge Diagnoses:  Principal Problem:   Iron  deficiency anemia due to chronic blood loss   Discharged Condition: stable and improved  Hospital Course:  Pt was admitted for observation due to symptomatic iron  deficiency anemia. She had a hemoglobin of 6.8 and was admitted for the transfusion of 2 units of pRBC. She is scheduled for robotic hysterectomy with Bilateral salpingectomy on 10/21. She received 2 units pRBC with increase in hgb to 9.0 prior to discharged. She is discharged home in stable and improved condition to return tomorrow for scheduled hysterctomy.   Consults: None  Significant Diagnostic Studies: labs: hgb 6.8 after 2 units pRBC increased to hgb of 9.0  Treatments: blood transfusion   Discharge Exam: Blood pressure 131/78, pulse 81, temperature 98.7 F (37.1 C), temperature source Oral, resp. rate 17, SpO2 100%. General appearance: alert, cooperative, and no distress Resp: no distress  GI: soft, non-tender; bowel sounds normal; no masses,  no organomegaly Extremities: extremities normal, atraumatic, no cyanosis or edema  Disposition: Discharge disposition: 01-Home or Self Care        Allergies as of 05/01/2024   No Known Allergies      Medication List     TAKE these medications    acetaminophen  650 MG CR tablet Commonly known as: TYLENOL  Take 1,300 mg by mouth every 8 (eight) hours as needed for pain.   amLODipine 2.5 MG tablet Commonly known as: NORVASC Take 2.5 mg by mouth daily.   calcium carbonate 500 MG chewable tablet Commonly known as: TUMS - dosed in mg elemental calcium Chew 1 tablet by mouth as needed for indigestion or heartburn.   folic acid  1 MG tablet Commonly known as:  FOLVITE  Take 1 tablet (1 mg total) by mouth daily.   Fusion Plus Caps Take 1 capsule by mouth daily.   losartan  100 MG tablet Commonly known as: COZAAR  Take 100 mg by mouth daily.   norethindrone 0.35 MG tablet Commonly known as: MICRONOR Take 1 tablet by mouth daily.        Follow-up Information     Rosalva Sawyer, MD. Go in 2 week(s).   Specialty: Obstetrics and Gynecology Why: keep appointment for surgery tomorrow and appointment for 2 week postoperative visit Contact information: [address] [place] [address] [phone]                 Signed: Sawyer Rosalva 05/01/2024, 5:51 PM

## 2024-05-01 NOTE — Progress Notes (Signed)
 CRITICAL RESULT PROVIDER NOTIFICATION  Test performed and critical result:  Hg 6.6  Date and time result received:  05-01-2024 0930 by Olam Daughters RN received the call from [facility] lab  Provider name/title: Dr Rosalva MD  Date and time provider notified: 05-01-2024 1115  Date and time provider responded: 05-01-2024 1115  Provider response:Dr Rosalva which had already stated when she came at her PAT lab appointment today that she was received blood transfusion today.  Dr Rosalva stated patient is being admitted to be transfused two units of blood today.

## 2024-05-01 NOTE — Progress Notes (Signed)
 Critical lab value of hgb 6.6. Karna Goldberg, RN with [facility] PAT made aware.

## 2024-05-02 ENCOUNTER — Other Ambulatory Visit: Payer: Self-pay

## 2024-05-02 ENCOUNTER — Encounter (HOSPITAL_COMMUNITY): Payer: Self-pay | Admitting: Obstetrics and Gynecology

## 2024-05-02 ENCOUNTER — Observation Stay (HOSPITAL_COMMUNITY): Admitting: Anesthesiology

## 2024-05-02 ENCOUNTER — Observation Stay (HOSPITAL_COMMUNITY)
Admission: RE | Admit: 2024-05-02 | Discharge: 2024-05-02 | Disposition: A | Discharge status: Home / Self Care | Attending: Obstetrics and Gynecology | Admitting: Obstetrics and Gynecology

## 2024-05-02 ENCOUNTER — Encounter (HOSPITAL_COMMUNITY): Admission: RE | Disposition: A | Payer: Self-pay | Source: Home / Self Care | Attending: Obstetrics and Gynecology

## 2024-05-02 DIAGNOSIS — Z79899 Other long term (current) drug therapy: Secondary | ICD-10-CM | POA: Insufficient documentation

## 2024-05-02 DIAGNOSIS — D500 Iron deficiency anemia secondary to blood loss (chronic): Secondary | ICD-10-CM | POA: Insufficient documentation

## 2024-05-02 DIAGNOSIS — N920 Excessive and frequent menstruation with regular cycle: Secondary | ICD-10-CM

## 2024-05-02 DIAGNOSIS — D219 Benign neoplasm of connective and other soft tissue, unspecified: Secondary | ICD-10-CM | POA: Diagnosis present

## 2024-05-02 DIAGNOSIS — D259 Leiomyoma of uterus, unspecified: Secondary | ICD-10-CM | POA: Diagnosis not present

## 2024-05-02 DIAGNOSIS — F1090 Alcohol use, unspecified, uncomplicated: Secondary | ICD-10-CM | POA: Insufficient documentation

## 2024-05-02 DIAGNOSIS — Z90710 Acquired absence of both cervix and uterus: Secondary | ICD-10-CM | POA: Diagnosis not present

## 2024-05-02 DIAGNOSIS — N888 Other specified noninflammatory disorders of cervix uteri: Secondary | ICD-10-CM | POA: Diagnosis not present

## 2024-05-02 DIAGNOSIS — Z01818 Encounter for other preprocedural examination: Principal | ICD-10-CM

## 2024-05-02 DIAGNOSIS — I10 Essential (primary) hypertension: Secondary | ICD-10-CM | POA: Diagnosis not present

## 2024-05-02 HISTORY — DX: Sickle-cell trait: D57.3

## 2024-05-02 HISTORY — PX: ROBOTIC ASSISTED TOTAL HYSTERECTOMY WITH BILATERAL SALPINGO OOPHERECTOMY: SHX6086

## 2024-05-02 HISTORY — DX: Leiomyoma of uterus, unspecified: D25.9

## 2024-05-02 HISTORY — DX: Thalassemia minor: D56.3

## 2024-05-02 HISTORY — DX: Iron deficiency anemia secondary to blood loss (chronic): D50.0

## 2024-05-02 HISTORY — DX: Personal history of other diseases of the female genital tract: Z87.42

## 2024-05-02 HISTORY — DX: Personal history of other mental and behavioral disorders: Z86.59

## 2024-05-02 HISTORY — DX: Gastro-esophageal reflux disease without esophagitis: K21.9

## 2024-05-02 HISTORY — DX: Generalized anxiety disorder: F41.1

## 2024-05-02 HISTORY — DX: Presence of spectacles and contact lenses: Z97.3

## 2024-05-02 HISTORY — DX: Excessive and frequent menstruation with regular cycle: N92.0

## 2024-05-02 HISTORY — DX: Dysmenorrhea, unspecified: N94.6

## 2024-05-02 HISTORY — DX: Unspecified ovarian cyst, left side: N83.202

## 2024-05-02 LAB — TYPE AND SCREEN
ABO/RH(D): B POS
ABO/RH(D): B POS
Antibody Screen: NEGATIVE
Antibody Screen: NEGATIVE
Unit division: 0
Unit division: 0

## 2024-05-02 LAB — BPAM RBC
Blood Product Expiration Date: 202511222359
Blood Product Expiration Date: 202511232359
ISSUE DATE / TIME: 202510201258
ISSUE DATE / TIME: 202510201549
Unit Type and Rh: 7300
Unit Type and Rh: 7300

## 2024-05-02 LAB — POCT PREGNANCY, URINE: Preg Test, Ur: NEGATIVE

## 2024-05-02 SURGERY — HYSTERECTOMY, TOTAL, ROBOT-ASSISTED, LAPAROSCOPIC, WITH BILATERAL SALPINGO-OOPHORECTOMY
Anesthesia: General | Site: Pelvis

## 2024-05-02 MED ORDER — BUPIVACAINE LIPOSOME 1.3 % IJ SUSP
INTRAMUSCULAR | Status: DC | PRN
  Administered 2024-05-02: 50 mL

## 2024-05-02 MED ORDER — ACETAMINOPHEN 500 MG PO TABS
1000.0000 mg | ORAL_TABLET | Freq: Four times a day (QID) | ORAL | Status: DC
  Administered 2024-05-02: 1000 mg via ORAL
  Filled 2024-05-02: qty 2

## 2024-05-02 MED ORDER — KETOROLAC TROMETHAMINE 30 MG/ML IJ SOLN
INTRAMUSCULAR | Status: DC | PRN
  Administered 2024-05-02: 30 mg via INTRAVENOUS

## 2024-05-02 MED ORDER — SODIUM CHLORIDE (PF) 0.9 % IJ SOLN
INTRAMUSCULAR | Status: AC
  Filled 2024-05-02: qty 50

## 2024-05-02 MED ORDER — ZOLPIDEM TARTRATE 5 MG PO TABS: 5.0000 mg | ORAL_TABLET | Freq: Every evening | ORAL | Status: DC | PRN

## 2024-05-02 MED ORDER — ROCURONIUM BROMIDE 10 MG/ML (PF) SYRINGE
PREFILLED_SYRINGE | INTRAVENOUS | Status: AC
  Filled 2024-05-02: qty 10

## 2024-05-02 MED ORDER — ROPIVACAINE HCL 5 MG/ML IJ SOLN
INTRAMUSCULAR | Status: AC
  Filled 2024-05-02: qty 30

## 2024-05-02 MED ORDER — CHLORHEXIDINE GLUCONATE 0.12 % MT SOLN
OROMUCOSAL | Status: AC
  Filled 2024-05-02: qty 15

## 2024-05-02 MED ORDER — GABAPENTIN 300 MG PO CAPS
ORAL_CAPSULE | ORAL | Status: AC
  Filled 2024-05-02: qty 1

## 2024-05-02 MED ORDER — SIMETHICONE 80 MG PO CHEW: 80.0000 mg | CHEWABLE_TABLET | Freq: Four times a day (QID) | ORAL | Status: DC | PRN

## 2024-05-02 MED ORDER — MENTHOL 3 MG MT LOZG: 1.0000 | LOZENGE | OROMUCOSAL | Status: DC | PRN

## 2024-05-02 MED ORDER — ALUM & MAG HYDROXIDE-SIMETH 200-200-20 MG/5ML PO SUSP: 30.0000 mL | ORAL | Status: DC | PRN

## 2024-05-02 MED ORDER — DEXMEDETOMIDINE HCL IN NACL 80 MCG/20ML IV SOLN
INTRAVENOUS | Status: DC | PRN
  Administered 2024-05-02: 4 ug via INTRAVENOUS
  Administered 2024-05-02 (×2): 8 ug via INTRAVENOUS

## 2024-05-02 MED ORDER — BUPIVACAINE HCL (PF) 0.25 % IJ SOLN
INTRAMUSCULAR | Status: AC
  Filled 2024-05-02: qty 30

## 2024-05-02 MED ORDER — PANTOPRAZOLE SODIUM 40 MG PO TBEC
40.0000 mg | DELAYED_RELEASE_TABLET | Freq: Every day | ORAL | Status: DC
  Administered 2024-05-02: 40 mg via ORAL
  Filled 2024-05-02: qty 1

## 2024-05-02 MED ORDER — ROCURONIUM BROMIDE 10 MG/ML (PF) SYRINGE
PREFILLED_SYRINGE | INTRAVENOUS | Status: DC | PRN
  Administered 2024-05-02: 30 mg via INTRAVENOUS
  Administered 2024-05-02: 40 mg via INTRAVENOUS

## 2024-05-02 MED ORDER — SODIUM CHLORIDE 0.9 % IR SOLN
Status: DC | PRN
  Administered 2024-05-02: 1000 mL

## 2024-05-02 MED ORDER — ONDANSETRON HCL 4 MG/2ML IJ SOLN
INTRAMUSCULAR | Status: AC
  Filled 2024-05-02: qty 2

## 2024-05-02 MED ORDER — DEXAMETHASONE SOD PHOSPHATE PF 10 MG/ML IJ SOLN
INTRAMUSCULAR | Status: DC | PRN
  Administered 2024-05-02: 10 mg via INTRAVENOUS

## 2024-05-02 MED ORDER — ONDANSETRON HCL 4 MG/2ML IJ SOLN
INTRAMUSCULAR | Status: DC | PRN
  Administered 2024-05-02: 4 mg via INTRAVENOUS

## 2024-05-02 MED ORDER — GABAPENTIN 300 MG PO CAPS
300.0000 mg | ORAL_CAPSULE | ORAL | Status: AC
  Administered 2024-05-02: 300 mg via ORAL

## 2024-05-02 MED ORDER — CHLORHEXIDINE GLUCONATE 0.12 % MT SOLN
15.0000 mL | Freq: Once | OROMUCOSAL | Status: AC
  Administered 2024-05-02: 15 mL via OROMUCOSAL

## 2024-05-02 MED ORDER — FENTANYL CITRATE (PF) 100 MCG/2ML IJ SOLN
25.0000 ug | INTRAMUSCULAR | Status: DC | PRN
  Administered 2024-05-02: 25 ug via INTRAVENOUS

## 2024-05-02 MED ORDER — AMLODIPINE BESYLATE 5 MG PO TABS: 2.5000 mg | ORAL_TABLET | Freq: Every day | ORAL | Status: DC

## 2024-05-02 MED ORDER — ACETAMINOPHEN 500 MG PO TABS
1000.0000 mg | ORAL_TABLET | ORAL | Status: AC
  Administered 2024-05-02: 1000 mg via ORAL

## 2024-05-02 MED ORDER — OXYCODONE HCL 5 MG/5ML PO SOLN: 5.0000 mg | Freq: Once | ORAL | Status: DC | PRN

## 2024-05-02 MED ORDER — LACTATED RINGERS IV SOLN: INTRAVENOUS | Status: DC

## 2024-05-02 MED ORDER — LIDOCAINE 2% (20 MG/ML) 5 ML SYRINGE
INTRAMUSCULAR | Status: DC | PRN
  Administered 2024-05-02: 60 mg via INTRAVENOUS

## 2024-05-02 MED ORDER — SUGAMMADEX SODIUM 200 MG/2ML IV SOLN
INTRAVENOUS | Status: DC | PRN
  Administered 2024-05-02: 200 mg via INTRAVENOUS

## 2024-05-02 MED ORDER — BUPIVACAINE LIPOSOME 1.3 % IJ SUSP
INTRAMUSCULAR | Status: AC
  Filled 2024-05-02: qty 20

## 2024-05-02 MED ORDER — ACETAMINOPHEN 500 MG PO TABS
ORAL_TABLET | ORAL | Status: AC
  Filled 2024-05-02: qty 2

## 2024-05-02 MED ORDER — ACETAMINOPHEN 500 MG PO TABS: 1000.0000 mg | ORAL_TABLET | Freq: Three times a day (TID) | ORAL | 0 refills | Status: AC | PRN

## 2024-05-02 MED ORDER — PROPOFOL 10 MG/ML IV BOLUS
INTRAVENOUS | Status: AC
Start: 2024-05-02 — End: 2024-05-02
  Filled 2024-05-02: qty 20

## 2024-05-02 MED ORDER — AMISULPRIDE (ANTIEMETIC) 5 MG/2ML IV SOLN: 10.0000 mg | Freq: Once | INTRAVENOUS | Status: DC | PRN

## 2024-05-02 MED ORDER — FENTANYL CITRATE (PF) 250 MCG/5ML IJ SOLN
INTRAMUSCULAR | Status: DC | PRN
Start: 2024-05-02 — End: 2024-05-02
  Administered 2024-05-02 (×2): 50 ug via INTRAVENOUS

## 2024-05-02 MED ORDER — IBUPROFEN 600 MG PO TABS
600.0000 mg | ORAL_TABLET | Freq: Four times a day (QID) | ORAL | Status: DC
  Administered 2024-05-02: 600 mg via ORAL
  Filled 2024-05-02: qty 1

## 2024-05-02 MED ORDER — SODIUM CHLORIDE 0.9 % IV SOLN
2.0000 g | INTRAVENOUS | Status: AC
  Administered 2024-05-02: 2 g via INTRAVENOUS
  Filled 2024-05-02: qty 2

## 2024-05-02 MED ORDER — OXYCODONE HCL 5 MG PO TABS: 5.0000 mg | ORAL_TABLET | Freq: Once | ORAL | Status: DC | PRN

## 2024-05-02 MED ORDER — 0.9 % SODIUM CHLORIDE (POUR BTL) OPTIME
TOPICAL | Status: DC | PRN
  Administered 2024-05-02: 1000 mL

## 2024-05-02 MED ORDER — HYDROMORPHONE HCL 1 MG/ML IJ SOLN
0.2000 mg | INTRAMUSCULAR | Status: DC | PRN
  Administered 2024-05-02: 0.6 mg via INTRAVENOUS
  Filled 2024-05-02: qty 1

## 2024-05-02 MED ORDER — SENNA 8.6 MG PO TABS
1.0000 | ORAL_TABLET | Freq: Two times a day (BID) | ORAL | Status: DC
  Administered 2024-05-02: 8.6 mg via ORAL
  Filled 2024-05-02: qty 1

## 2024-05-02 MED ORDER — ONDANSETRON HCL 4 MG PO TABS: 4.0000 mg | ORAL_TABLET | Freq: Four times a day (QID) | ORAL | Status: DC | PRN

## 2024-05-02 MED ORDER — DEXMEDETOMIDINE HCL IN NACL 80 MCG/20ML IV SOLN
INTRAVENOUS | Status: AC
  Filled 2024-05-02: qty 20

## 2024-05-02 MED ORDER — MIDAZOLAM HCL 2 MG/2ML IJ SOLN
INTRAMUSCULAR | Status: AC
Start: 2024-05-02 — End: 2024-05-02
  Filled 2024-05-02: qty 2

## 2024-05-02 MED ORDER — KETOROLAC TROMETHAMINE 30 MG/ML IJ SOLN
INTRAMUSCULAR | Status: AC
  Filled 2024-05-02: qty 1

## 2024-05-02 MED ORDER — FENTANYL CITRATE (PF) 250 MCG/5ML IJ SOLN
INTRAMUSCULAR | Status: AC
  Filled 2024-05-02: qty 5

## 2024-05-02 MED ORDER — IBUPROFEN 600 MG PO TABS: 600.0000 mg | ORAL_TABLET | Freq: Four times a day (QID) | ORAL | 1 refills | Status: AC | PRN

## 2024-05-02 MED ORDER — FENTANYL CITRATE (PF) 100 MCG/2ML IJ SOLN
INTRAMUSCULAR | Status: AC
  Filled 2024-05-02: qty 2

## 2024-05-02 MED ORDER — MIDAZOLAM HCL (PF) 2 MG/2ML IJ SOLN
INTRAMUSCULAR | Status: DC | PRN
  Administered 2024-05-02: 2 mg via INTRAVENOUS

## 2024-05-02 MED ORDER — POVIDONE-IODINE 10 % EX SWAB
2.0000 | Freq: Once | CUTANEOUS | Status: AC
  Administered 2024-05-02: 2 via TOPICAL

## 2024-05-02 MED ORDER — OXYCODONE HCL 5 MG PO TABS: 5.0000 mg | ORAL_TABLET | Freq: Four times a day (QID) | ORAL | 0 refills | Status: AC | PRN

## 2024-05-02 MED ORDER — ORAL CARE MOUTH RINSE: 15.0000 mL | Freq: Once | OROMUCOSAL | Status: AC

## 2024-05-02 MED ORDER — PROPOFOL 10 MG/ML IV BOLUS
INTRAVENOUS | Status: DC | PRN
  Administered 2024-05-02: 170 mg via INTRAVENOUS

## 2024-05-02 MED ORDER — OXYCODONE HCL 5 MG PO TABS: 5.0000 mg | ORAL_TABLET | ORAL | Status: DC | PRN

## 2024-05-02 MED ORDER — ONDANSETRON HCL 4 MG/2ML IJ SOLN: 4.0000 mg | Freq: Four times a day (QID) | INTRAMUSCULAR | Status: DC | PRN

## 2024-05-02 MED ORDER — ROPIVACAINE HCL 5 MG/ML IJ SOLN
INTRAMUSCULAR | Status: DC | PRN
  Administered 2024-05-02: 60 mL

## 2024-05-02 SURGICAL SUPPLY — 55 items
BARRIER ADHS 3X4 INTERCEED (GAUZE/BANDAGES/DRESSINGS) IMPLANT
CANNULA CAP OBTURATR AIRSEAL 8 (CAP) ×3 IMPLANT
COVER BACK TABLE 60X90IN (DRAPES) ×3 IMPLANT
COVER TIP SHEARS 8 DVNC (MISCELLANEOUS) ×3 IMPLANT
DEFOGGER SCOPE WARM SEASHARP (MISCELLANEOUS) ×3 IMPLANT
DERMABOND ADVANCED .7 DNX12 (GAUZE/BANDAGES/DRESSINGS) ×3 IMPLANT
DERMABOND ADVANCED .7 DNX6 (GAUZE/BANDAGES/DRESSINGS) ×1 IMPLANT
DILATOR CANAL MILEX (MISCELLANEOUS) ×3 IMPLANT
DRAPE ARM DVNC X/XI (DISPOSABLE) ×12 IMPLANT
DRAPE COLUMN DVNC XI (DISPOSABLE) ×3 IMPLANT
DRAPE SURG IRRIG POUCH 19X23 (DRAPES) ×3 IMPLANT
DRAPE UTILITY 15X26 TOWEL STRL (DRAPES) ×3 IMPLANT
DRIVER NDL MEGA 8 DVNC XI (INSTRUMENTS) ×2 IMPLANT
DRIVER NDLE MEGA DVNC XI (INSTRUMENTS) ×2 IMPLANT
DURAPREP 26ML APPLICATOR (WOUND CARE) ×3 IMPLANT
ELECTRODE REM PT RTRN 9FT ADLT (ELECTROSURGICAL) ×3 IMPLANT
FORCEPS BPLR LNG DVNC XI (INSTRUMENTS) ×3 IMPLANT
FORCEPS PROGRASP DVNC XI (FORCEP) IMPLANT
GAUZE 4X4 16PLY ~~LOC~~+RFID DBL (SPONGE) IMPLANT
GLOVE BIOGEL M 6.5 STRL (GLOVE) ×9 IMPLANT
GLOVE BIOGEL PI IND STRL 6.5 (GLOVE) ×9 IMPLANT
GRASPER COBRA DVNC RU (INSTRUMENTS) ×3 IMPLANT
HIBICLENS CHG 4% 4OZ BTL (MISCELLANEOUS) ×6 IMPLANT
IRRIGATION STRYKERFLOW (MISCELLANEOUS) ×3 IMPLANT
KIT PINK PAD W/HEAD ARM REST (MISCELLANEOUS) ×3 IMPLANT
LEGGING LITHOTOMY PAIR STRL (DRAPES) ×3 IMPLANT
MANIPULATOR ADVINCU DEL 2.5 PL (MISCELLANEOUS) IMPLANT
MANIPULATOR ADVINCU DEL 3.0 PL (MISCELLANEOUS) IMPLANT
MANIPULATOR ADVINCU DEL 3.5 PL (MISCELLANEOUS) IMPLANT
MANIPULATOR ADVINCU DEL 4.0 PL (MISCELLANEOUS) ×1 IMPLANT
OBTURATOR OPTICALSTD 8 DVNC (TROCAR) ×3 IMPLANT
OCCLUDER COLPOPNEUMO (BALLOONS) IMPLANT
PACK ROBOT WH (CUSTOM PROCEDURE TRAY) ×3 IMPLANT
PACK ROBOTIC GOWN (GOWN DISPOSABLE) ×3 IMPLANT
PAD OB MATERNITY 11 LF (PERSONAL CARE ITEMS) ×3 IMPLANT
POWDER SURGICEL 3.0 GRAM (HEMOSTASIS) IMPLANT
RETRACTOR WND ALEXIS 18 MED (MISCELLANEOUS) IMPLANT
SCISSORS LAP 5X45 EPIX DISP (ENDOMECHANICALS) ×3 IMPLANT
SCISSORS MNPLR CVD DVNC XI (INSTRUMENTS) ×3 IMPLANT
SEAL UNIV 5-12 XI (MISCELLANEOUS) ×6 IMPLANT
SEALER VESSEL EXT DVNC XI (MISCELLANEOUS) ×1 IMPLANT
SET IRRIG Y TYPE TUR BLADDER L (SET/KITS/TRAYS/PACK) IMPLANT
SET TRI-LUMEN FLTR TB AIRSEAL (TUBING) IMPLANT
SET TUBE FILTERED XL AIRSEAL (SET/KITS/TRAYS/PACK) ×3 IMPLANT
SOLN STERILE WATER BTL 1000 ML (IV SOLUTION) ×3 IMPLANT
SPIKE FLUID TRANSFER (MISCELLANEOUS) ×6 IMPLANT
SUT VIC AB 0 CT1 27XBRD ANBCTR (SUTURE) ×6 IMPLANT
SUT VICRYL 0 UR6 27IN ABS (SUTURE) IMPLANT
SUT VICRYL RAPIDE 4/0 PS 2 (SUTURE) ×6 IMPLANT
SUT VLOC 180 0 9IN GS21 (SUTURE) ×3 IMPLANT
TIP ENDOSCOPIC SURGICEL (TIP) IMPLANT
TOWEL GREEN STERILE (TOWEL DISPOSABLE) ×3 IMPLANT
TRAY FOLEY W/BAG SLVR 14FR LF (SET/KITS/TRAYS/PACK) ×1 IMPLANT
TROCAR PORT AIRSEAL 8X120 (TROCAR) IMPLANT
UNDERPAD 30X36 HEAVY ABSORB (UNDERPADS AND DIAPERS) ×3 IMPLANT

## 2024-05-02 NOTE — Plan of Care (Signed)

## 2024-05-02 NOTE — H&P (Signed)
 Date of Initial H&P: 04/30/2024  History reviewed, patient examined, no change in status, stable for surgery. She received 2 units of pRBC yesterday due to hgb of 6.8. posttransfusion hgb was 9.0

## 2024-05-02 NOTE — Progress Notes (Signed)
   05/02/24 1941  Departure Condition  Departure Condition Good  Mobility at [employer]  Patient/Caregiver Teaching Teach Back Method Used;Discharge instructions reviewed;Prescriptions reviewed;Pain management discussed;Patient/caregiver verbalized understanding;Medications discussed;Follow-up care reviewed;Admission discussed  Departure Mode With significant other;With family   Patient alert and oriented x4, VS and pain stable at discharge.

## 2024-05-02 NOTE — Anesthesia Preprocedure Evaluation (Signed)
 Anesthesia Evaluation  Patient identified by MRN, date of birth, ID band Patient awake    Reviewed: Allergy & Precautions, NPO status , Patient's Chart, lab work & pertinent test results  Airway Mallampati: II  TM Distance: >3 FB Neck ROM: Full    Dental  (+) Dental Advisory Given   Pulmonary neg pulmonary ROS   breath sounds clear to auscultation       Cardiovascular hypertension, Pt. on medications  Rhythm:Regular Rate:Normal     Neuro/Psych negative neurological ROS     GI/Hepatic Neg liver ROS,GERD  ,,  Endo/Other  negative endocrine ROS    Renal/GU negative Renal ROS     Musculoskeletal   Abdominal   Peds  Hematology  (+) Blood dyscrasia, anemia   Anesthesia Other Findings   Reproductive/Obstetrics                              Anesthesia Physical Anesthesia Plan  ASA: 2  Anesthesia Plan: General   Post-op Pain Management: Tylenol  PO (pre-op)* and Gabapentin PO (pre-op)*   Induction: Intravenous  PONV Risk Score and Plan: 4 or greater and Ondansetron , Dexamethasone, Midazolam, Treatment may vary due to age or medical condition and Scopolamine patch - Pre-op  Airway Management Planned: Oral ETT  Additional Equipment:   Intra-op Plan:   Post-operative Plan: Extubation in OR  Informed Consent: I have reviewed the patients History and Physical, chart, labs and discussed the procedure including the risks, benefits and alternatives for the proposed anesthesia with the patient or authorized representative who has indicated his/her understanding and acceptance.     Dental advisory given  Plan Discussed with: CRNA  Anesthesia Plan Comments:         Anesthesia Quick Evaluation

## 2024-05-02 NOTE — Op Note (Signed)
 05/02/2024  12:16 PM  PATIENT:  Ashley Stewart  38 y.o. female  PRE-OPERATIVE DIAGNOSIS:  Menorrhagia with regular cycle  POST-OPERATIVE DIAGNOSIS:  Menorrhagia with regular cycle  PROCEDURE:  Procedure(s): HYSTERECTOMY, TOTAL, ROBOT-ASSISTED, LAPAROSCOPIC, WITH BILATERAL SALPINGECTOMY (N/A)  SURGEON:  Surgeons and Role:    DEWAINE Rosalva Sawyer, MD - Primary  PHYSICIAN ASSISTANT:   ASSISTANTS: Heather Kreightzmeyer RNFA An experienced assistant was required given the standard of surgical care given the complexity of the case.  This assistant was needed for exposure, dissection, suctioning, retraction, instrument exchange, assisting with delivery with administration of fundal pressure, and for overall help during the procedure.    ANESTHESIA:   general  EBL:  30 mL   BLOOD ADMINISTERED:none  DRAINS: Urinary Catheter ([place])   LOCAL MEDICATIONS USED:  OTHER ropivicaine / exparel  with marcaine    SPECIMEN:  Source of Specimen:  uterus cervix and bilateral fallopian tubes   DISPOSITION OF SPECIMEN:  PATHOLOGY  COUNTS:  YES  TOURNIQUET:  * No tourniquets in log *  DICTATION: .Note written in EPIC  PLAN OF CARE: Admit for overnight observation  PATIENT DISPOSITION:  PACU - hemodynamically stable.   Delay start of Pharmacological VTE agent (>24hrs) due to surgical blood loss or risk of bleeding: not applicable  Findings: normal external genitalia, vaginal mucosa and cervix. Enlarged fibroid uterus . Normal appearing fallopian tubes and ovaries.   Procedure: The patient was taken to the operating room #6 at [facility] where she was placed under general anesthesia. Time out was performed. [name] She was placed in dorsal lithotomy position and prepped and draped in the usual sterile fashion. A weighted speculum was placed into the vagina. A Deaver was placed anteriorly for retraction. The anterior lip of the cervix was grasped with a single-tooth tenaculum. The vaginal mucosa was  injected with 2.5 cc of ropivicaine  at the 2/4/ 8 and 10 o'clock positions. The uterus was sounded to 8 cm. the cervix was dilated to 6 mm . 0 vicryl suture placed at the 12  position Of the cervix to facilitate placement of a uterine manipulator. The manipulator was placed without difficulty. Weighted speculum and Deaver were removed.   Attention was turned to the patient's abdomen where a 8 mm trocar was placed at the umbilicus under direct visualization . The pneumoperitoneum was achieved with PCO2 gas.  An 8 mm trocar was placed in the right upper quadrant 10 centimeters from the umbilicus.later connected to robotic arm #4). An 8 mm incision was made in the left upper quadrant 10 cm from the umbilicus and connected to robot arm #2. ( All incision sites were injected with 10cc of exparel /marcaine  prior to port placement. )  Once all ports had been placed under direct visualization.The laparoscope was removed and the da Vinci robotic system was thin right-sided docked. The robotic arms were connected to the corresponding trocars as listed above. The laparoscope was then reinserted. The long tip bipolar forceps were placed into port # 2. A vessel sealer was placed in port #4. All instruments were directed into the pelvis under direct visualization.   Attention was turned to the surgeons console. The left mesosalpinx and  utero-ovarian ligament was cauterized and transected with the vessel sealer The broad ligament was cauterized and transected with the vessel sealer .The round ligament was cauterized and transected with the vessel sealer  The anterior leaf of broad ligament was incised along the bladder reflection to the midline.  The right mesosalpinx and utero-ovarian  ligament was cauterized and transected with the vessel sealer. The right broad ligament was cauterized and transected with the vessel sealer. The right round ligament was cauterized and transected with the vessel sealer The broad ligament was  incised to the midline. The bladder was dissected off the lower uterine segments of the cervix via sharp and blunt dissection.   The uterine arteries were skeletonized bilaterally. They were  cauterized and transected with the vessel sealer The KOH ring was identified. The anterior colpotomy was performed followed by the posterior colpotomy. Once the uterus and cervix were completely excised , they  were removed through the vagina. The  bipolar forceps and scissors were removed and cobra forceps were placed in the port #2 and the mega needle driver was placed in to port #4.  The vaginal cuff was closed with running suture if 0 v-lock. The pelvis was irrigated. Excellent hemostasis was noted. All pelvic pedicles were examined and hemostasis was noted.  All instruments removed from the ports. All ports were removed under direct Visualization. The pneumoperitoneum was released. The skin incisions were closed with 4-0 Vicryl and then covered with Derma bond.     Sponge lap and needle counts weIre correct x 2. The patient was awakened from anesthesia and taken to the recovery room in stable condition.

## 2024-05-02 NOTE — Discharge Summary (Signed)
 Physician Discharge Summary  Patient ID: Ashley Stewart MRN: [id number] DOB/AGE: Nov 13, 1985 38 y.o.  Admit date: 05/02/2024 Discharge date: 05/02/2024  Admission Diagnoses:1) Fibroids 2) Menorrhagia 3) Iron  deficiency Anemia due to chronic blood loss   Discharge Diagnoses:  Principal Problem:   Fibroids Active Problems:   S/P laparoscopic hysterectomy   Discharged Condition: stable  Hospital Course:  patient was admitted for observation after undergoing a HYSTERECTOMY, TOTAL, ROBOT-ASSISTED, LAPAROSCOPIC, WITH BILATERAL SALPINGECTOMY . She did well postoperatively and is discharged home in stable condition on post op day #0. Upon discharge she was ambulating, tolerating po, urinating and pain was well controlled with oral pain medication.   Consults: None  Significant Diagnostic Studies: labs:  Recent Results (from the past 2160 hours)  CBC with Differential (Cancer Center Only)     Status: Abnormal   Collection Time: 02/16/24 11:34 AM  Result Value Ref Range   WBC Count 3.8 (L) 4.0 - 10.5 K/uL   RBC 3.24 (L) 3.87 - 5.11 MIL/uL   Hemoglobin 7.4 (L) 12.0 - 15.0 g/dL    Comment: Reticulocyte Hemoglobin testing may be clinically indicated, consider ordering this additional test [id number]    HCT 24.0 (L) 36.0 - 46.0 %   MCV 74.1 (L) 80.0 - 100.0 fL   MCH 22.8 (L) 26.0 - 34.0 pg   MCHC 30.8 30.0 - 36.0 g/dL   RDW 78.7 (H) 88.4 - 84.4 %   Platelet Count 266 150 - 400 K/uL   nRBC 0.0 0.0 - 0.2 %   Neutrophils Relative % 50 %   Neutro Abs 2.0 1.7 - 7.7 K/uL   Lymphocytes Relative 41 %   Lymphs Abs 1.6 0.7 - 4.0 K/uL   Monocytes Relative 7 %   Monocytes Absolute 0.3 0.1 - 1.0 K/uL   Eosinophils Relative 1 %   Eosinophils Absolute 0.0 0.0 - 0.5 K/uL   Basophils Relative 1 %   Basophils Absolute 0.0 0.0 - 0.1 K/uL   Immature Granulocytes 0 %   Abs Immature Granulocytes 0.01 0.00 - 0.07 K/uL    Comment: Performed at [facility], [address] [place], [place]  [address]  Iron  and Iron  Binding Capacity ([facility] only)     Status: Abnormal   Collection Time: 02/16/24 11:34 AM  Result Value Ref Range   Iron  13 (L) 28 - 170 ug/dL   TIBC 568 749 - 549 ug/dL   Saturation Ratios 3 (L) 10.4 - 31.8 %   UIBC 418 ug/dL    Comment: Performed at [facility], [address] [place], [place] [address]  Ferritin     Status: None   Collection Time: 02/16/24 11:34 AM  Result Value Ref Range   Ferritin 47 11 - 307 ng/mL    Comment: Performed at [facility], [address] [place], [place] [address]  CBC     Status: Abnormal   Collection Time: 02/16/24  1:38 PM  Result Value Ref Range   WBC 3.1 (L) 4.0 - 10.5 K/uL   RBC 3.52 (L) 3.87 - 5.11 MIL/uL   Hemoglobin 7.7 (L) 12.0 - 15.0 g/dL    Comment: Reticulocyte Hemoglobin testing may be clinically indicated, consider ordering this additional test [id number]    HCT 26.6 (L) 36.0 - 46.0 %   MCV 75.6 (L) 80.0 - 100.0 fL   MCH 21.9 (L) 26.0 - 34.0 pg   MCHC 28.9 (L) 30.0 - 36.0 g/dL   RDW 78.5 (H) 88.4 - 84.4 %  Platelets 290 150 - 400 K/uL   nRBC 0.0 0.0 - 0.2 %    Comment: Performed at [facility], [address], [place], [place] [address]  Basic metabolic panel     Status: Abnormal   Collection Time: 02/16/24  1:38 PM  Result Value Ref Range   Sodium 136 135 - 145 mmol/L   Potassium 3.5 3.5 - 5.1 mmol/L   Chloride 106 98 - 111 mmol/L   CO2 22 22 - 32 mmol/L   Glucose, Bld 106 (H) 70 - 99 mg/dL    Comment: Glucose reference range applies only to samples taken after fasting for at least 8 hours.   BUN 8 6 - 20 mg/dL   Creatinine, Ser 9.29 0.44 - 1.00 mg/dL   Calcium 9.1 8.9 - 89.6 mg/dL   GFR, Estimated >39 >39 mL/min    Comment: (NOTE) Calculated using the CKD-EPI Creatinine Equation (2021)    Anion gap 8 5 - 15    Comment: Performed at [facility], [address] [place], [place] [address]  Type and screen [facility]     Status: None   Collection Time: 02/16/24  1:38 PM  Result Value Ref Range   ABO/RH(D) B POS    Antibody Screen NEG    Sample Expiration 02/19/2024,2359    Unit Number [id number]    Blood Component Type RED CELLS,LR    Unit division 00    Status of Unit ISSUED,FINAL    Transfusion Status OK TO TRANSFUSE    Crossmatch Result      Compatible Performed at [facility], [address] [place], [place] [address]   BPAM RBC     Status: None   Collection Time: 02/16/24  1:38 PM  Result Value Ref Range   ISSUE DATE / TIME 797491938452    Blood Product Unit Number [id number]    PRODUCT CODE [id number]    Unit Type and Rh 7300    Blood Product Expiration Date 797490987640   Prepare RBC (crossmatch)     Status: None   Collection Time: 02/16/24  3:02 PM  Result Value Ref Range   Order Confirmation      ORDER PROCESSED BY BLOOD BANK Performed at [facility], [address], [place], [place] [address]   CBC     Status: Abnormal   Collection Time: 03/02/24  8:25 AM  Result Value Ref Range   WBC 3.5 (L) 4.0 - 10.5 K/uL   RBC 4.11 3.87 - 5.11 MIL/uL   Hemoglobin 9.2 (L) 12.0 - 15.0 g/dL    Comment: Reticulocyte Hemoglobin testing may be clinically indicated, consider ordering this additional test [id number]    HCT 29.4 (L) 36.0 - 46.0 %   MCV 71.5 (L) 80.0 - 100.0 fL   MCH 22.4 (L) 26.0 - 34.0 pg   MCHC 31.3 30.0 - 36.0 g/dL   RDW 78.0 (H) 88.4 - 84.4 %   Platelets 196 150 - 400 K/uL   nRBC 0.0 0.0 - 0.2 %    Comment: Performed at [facility], [address] [place], [place] [address]  Sample to Blood Bank     Status: None   Collection Time: 03/02/24  8:26 AM  Result Value Ref Range   Blood Bank Specimen SAMPLE AVAILABLE FOR TESTING    Sample Expiration      03/05/2024,2359 Performed at [facility], [address] [place], [place]  [address]   CBC     Status: Abnormal   Collection Time:  05/01/24 10:00 AM  Result Value Ref Range   WBC 2.4 (L) 4.0 - 10.5 K/uL   RBC 3.55 (L) 3.87 - 5.11 MIL/uL   Hemoglobin 6.6 (LL) 12.0 - 15.0 g/dL    Comment: REPEATED TO VERIFY Reticulocyte Hemoglobin testing may be clinically indicated, consider ordering this additional test [id number] This critical result has been called to L HOGKIN,RN 1101 10.20.2025 D BRADLEY by Marinda Cain on 05/01/2024 11:01:07, and has been read back.    HCT 23.1 (L) 36.0 - 46.0 %   MCV 65.1 (L) 80.0 - 100.0 fL   MCH 18.6 (L) 26.0 - 34.0 pg   MCHC 28.6 (L) 30.0 - 36.0 g/dL   RDW 79.4 (H) 88.4 - 84.4 %   Platelets 296 150 - 400 K/uL   nRBC 0.0 0.0 - 0.2 %    Comment: Performed at [facility], [address] [place], [place] [address]  Basic metabolic panel per protocol     Status: Abnormal   Collection Time: 05/01/24 10:00 AM  Result Value Ref Range   Sodium 140 135 - 145 mmol/L   Potassium 3.7 3.5 - 5.1 mmol/L   Chloride 109 98 - 111 mmol/L   CO2 24 22 - 32 mmol/L   Glucose, Bld 97 70 - 99 mg/dL    Comment: Glucose reference range applies only to samples taken after fasting for at least 8 hours.   BUN 6 6 - 20 mg/dL   Creatinine, Ser 9.31 0.44 - 1.00 mg/dL   Calcium 8.6 (L) 8.9 - 10.3 mg/dL   GFR, Estimated >39 >39 mL/min    Comment: (NOTE) Calculated using the CKD-EPI Creatinine Equation (2021)    Anion gap 7 5 - 15    Comment: Performed at [facility], [address], [place], [place] [address]  Type and screen     Status: None   Collection Time: 05/01/24 11:06 AM  Result Value Ref Range   ABO/RH(D) B POS    Antibody Screen NEG    Sample Expiration      05/01/2024,2359 Performed at [facility], [address] [place], [place] [address]    Unit Number [id number]    Blood Component Type RED CELLS,LR    Unit division 00    Status of Unit ISSUED,FINAL    Transfusion Status OK TO TRANSFUSE    Crossmatch Result Compatible    Unit Number [id number]    Blood  Component Type RED CELLS,LR    Unit division 00    Status of Unit ISSUED,FINAL    Transfusion Status OK TO TRANSFUSE    Crossmatch Result Compatible   BPAM RBC     Status: None   Collection Time: 05/01/24 11:06 AM  Result Value Ref Range   ISSUE DATE / TIME [id number]    Blood Product Unit Number [id number]    PRODUCT CODE [id number]    Unit Type and Rh 7300    Blood Product Expiration Date 202511232359    ISSUE DATE / TIME 797489798741    Blood Product Unit Number [id number]    PRODUCT CODE [id number]    Unit Type and Rh 7300    Blood Product Expiration Date 202511222359   CBC     Status: Abnormal   Collection Time: 05/01/24 11:11 AM  Result Value Ref Range   WBC 2.7 (L) 4.0 - 10.5 K/uL   RBC 3.71 (L)  3.87 - 5.11 MIL/uL   Hemoglobin 6.8 (LL) 12.0 - 15.0 g/dL    Comment: REPEATED TO VERIFY CRITICAL VALUE NOTED.  VALUE IS CONSISTENT WITH PREVIOUSLY REPORTED AND CALLED VALUE. Reticulocyte Hemoglobin testing may be clinically indicated, consider ordering this additional test [id number]    HCT 23.9 (L) 36.0 - 46.0 %   MCV 64.4 (L) 80.0 - 100.0 fL   MCH 18.3 (L) 26.0 - 34.0 pg   MCHC 28.5 (L) 30.0 - 36.0 g/dL   RDW 79.6 (H) 88.4 - 84.4 %   Platelets 305 150 - 400 K/uL   nRBC 0.0 0.0 - 0.2 %    Comment: Performed at [facility], [address], [place], [place] [address]  Prepare RBC (crossmatch)     Status: None   Collection Time: 05/01/24 11:11 AM  Result Value Ref Range   Order Confirmation      ORDER PROCESSED BY BLOOD BANK Performed at [facility], [address] [place], [place] [address]   Hemoglobin and hematocrit, blood     Status: Abnormal   Collection Time: 05/01/24  6:43 PM  Result Value Ref Range   Hemoglobin 9.0 (L) 12.0 - 15.0 g/dL    Comment: POST TRANSFUSION SPECIMEN REPEATED TO VERIFY    HCT 29.9 (L) 36.0 - 46.0 %    Comment: Performed at [facility], [address] [place], [place] [address]  Pregnancy, urine POC     Status:  None   Collection Time: 05/02/24  9:03 AM  Result Value Ref Range   Preg Test, Ur NEGATIVE NEGATIVE    Comment:        THE SENSITIVITY OF THIS METHODOLOGY IS >20 mIU/mL.   Type and screen [facility]     Status: None   Collection Time: 05/02/24  9:40 AM  Result Value Ref Range   ABO/RH(D) B POS    Antibody Screen NEG    Sample Expiration      05/05/2024,2359 Performed at [facility], [address], [place], [place] [address]   Surgical pathology     Status: None   Collection Time: 05/02/24 11:28 AM  Result Value Ref Range   SURGICAL PATHOLOGY      SURGICAL PATHOLOGY CASE: [id number] PATIENT: ELANE ADA Surgical Pathology Report     Clinical History: menorrhagia with regular cycle (cm)   -  FINAL MICROSCOPIC DIAGNOSIS:  A. UTERUS AND CERVIX, WITH BILATERAL FALLOPIAN TUBES, HYSTERECTOMY: - Cervix: Benign, nabothian cysts - Endometrium: Benign, no hyperplasia or malignancy identified - Myometrium: Leiomyomata - Bilateral fallopian tubes: No significant pathologic changes   GROSS DESCRIPTION:  Specimen: Received fresh is uterus, cervix, bilateral fallopian tubes Specimen integrity: Intact Size and shape: The uterine body is distorted by underlying nodules and measures 8.5 x 7.1 x 6.9 cm Weight: 221 g Serosa: Pink-tan and smooth Cervix: 4.0 cm in length by 4.6 cm in diameter with a 1.3 cm slit shaped external os.  The ectocervical mucosa is pink-tan, smooth, and glistening.  The stroma is minimally roughened, but otherwise intact. Sectioning reveals an underlying pink-tan cut surface, without  distinct lesions. Endometrium: Pink-tan and distorted by an underlying white-tan, firm, well-defined, whorled nodule which measures 3.2 cm in greatest dimension.  No further distinct lesions are grossly identified and the endometrium has an average thickness of 0.1 cm. Myometrium: Pink-tan and distorted by multiple white-tan,  firm, well-defined, and whorled nodules ranging from 0.2 to 3.4 cm.  The myometrium  has a maximum thickness of 3.7 cm. Right adnexa: The right tube measures 5.7 cm in length by 0.9 cm in diameter and consists of a purple-tan, intact external surface that includes attached fimbria.  Sectioning reveals a patent lumen, without distinct lesions. Left adnexa: The left tube measures 6.7 cm in length by 0.8 cm in diameter and consists of a purple-tan, intact external surface that includes attached fimbria.  Sectioning reveals a patent lumen, without distinct lesions. Block Summary: A1 anterior cervix A2 anterior lower uterine segment A3 full-thickness  anterior endomyometrium A4 posterior cervix A5 posterior lower uterine segment A6 full-thickness posterior endomyometrium A7 posterior endometrium and underlying nodule A8 right tube A9 left tube  MARYSUE, 05/03/2024)  Final Diagnosis performed by Ilsa Pottier, MD.   Electronically signed 05/04/2024 Technical component performed at [facility]. [facility], [address], [place], [place] [address].  Professional component performed at [facility], [address] [place], [place] [address].  Immunohistochemistry Technical component (if applicable) was performed at [facility]. [address], [place], [place] [address].   IMMUNOHISTOCHEMISTRY DISCLAIMER (if applicable): Some of these immunohistochemical stains may have been developed and the performance characteristics determine by [facility]. Some may not have been cleared or approved by the U.S. Food and Drug Administration. The FDA has determined  that such clearance or approval is not necessary. This test is used for clinical purposes. It should not be regarded as investigational or for research. This laboratory is certified under the Clinical Laboratory Improvement Amendments of 1988 (CLIA-88) as qualified to perform  high complexity clinical laboratory testing.  The controls stained appropriately.   IHC stains are performed on formalin fixed, paraffin embedded tissue using a 3,3diaminobenzidine (DAB) chromogen and Leica Bond Autostainer System. The staining intensity of the nucleus is score manually and is reported as the percentage of tumor cell nuclei demonstrating specific nuclear staining. The specimens are fixed in 10% Neutral Formalin for at least 6 hours and up to 72hrs. These tests are validated on decalcified tissue. Results should be interpreted with caution given the possibility of false negative results on decalcified specimens. Antibody Clones are as follows ER-clone 54F, PR-clone 16, Ki67- clone MM 1. Some of these immunohistochemical stains may have been developed and the performance characteristics determined by [facility].       Treatments: surgery: HYSTERECTOMY, TOTAL, ROBOT-ASSISTED, LAPAROSCOPIC, WITH BILATERAL SALPINGECTOMY   Discharge Exam: Blood pressure (!) 152/85, pulse 67, temperature (!) 97.5 F (36.4 C), temperature source Oral, resp. rate 20, height 5' 9 (1.753 m), weight 78.5 kg, SpO2 98%. General appearance: alert, cooperative, and no distress Resp: No distress  GI: soft appropriately tender nondistended  Extremities: extremities normal, atraumatic, no cyanosis or edema Incision/Wound: well approximated no erythema or exudate   Disposition: Discharge disposition: 01-Home or Self Care       Discharge Instructions     Call MD for:  persistant nausea and vomiting   Complete by: As directed    Call MD for:  redness, tenderness, or signs of infection (pain, swelling, redness, odor or green/yellow discharge around incision site)   Complete by: As directed    Call MD for:  severe uncontrolled pain   Complete by: As directed    Call MD for:  temperature >100.4   Complete by: As directed    Diet - low sodium heart healthy   Complete by: As directed     Driving Restrictions   Complete by: As directed  Avoid driving for 1 week   Increase activity slowly   Complete by: As directed    Lifting restrictions   Complete by: As directed    Avoid lifting over 10 lbs for 6 weeks and until approved by Dr. Rosalva   May shower / Bathe   Complete by: As directed    May walk up steps   Complete by: As directed    No wound care   Complete by: As directed    Sexual Activity Restrictions   Complete by: As directed    Avoid sex for 6 weeks and until approved by Dr. Rosalva      Allergies as of 05/02/2024   No Known Allergies      Medication List     STOP taking these medications    acetaminophen  650 MG CR tablet Commonly known as: TYLENOL  Replaced by: acetaminophen  500 MG tablet       TAKE these medications    acetaminophen  500 MG tablet Commonly known as: TYLENOL  Take 2 tablets (1,000 mg total) by mouth every 8 (eight) hours as needed for mild pain (pain score 1-3). Replaces: acetaminophen  650 MG CR tablet   amLODipine 2.5 MG tablet Commonly known as: NORVASC Take 2.5 mg by mouth daily.   calcium carbonate 500 MG chewable tablet Commonly known as: TUMS - dosed in mg elemental calcium Chew 1 tablet by mouth as needed for indigestion or heartburn.   folic acid  1 MG tablet Commonly known as: FOLVITE  Take 1 tablet (1 mg total) by mouth daily.   Fusion Plus Caps Take 1 capsule by mouth daily.   ibuprofen  600 MG tablet Commonly known as: ADVIL  Take 1 tablet (600 mg total) by mouth every 6 (six) hours as needed for mild pain (pain score 1-3).   losartan  100 MG tablet Commonly known as: COZAAR  Take 100 mg by mouth daily.   norethindrone 0.35 MG tablet Commonly known as: MICRONOR Take 1 tablet by mouth daily.   oxyCODONE  5 MG immediate release tablet Commonly known as: Oxy IR/ROXICODONE  Take 1-2 tablets (5-10 mg total) by mouth every 6 (six) hours as needed for moderate pain (pain score 4-6).        Follow-up  Information     Rosalva Sawyer, MD. Go in 2 week(s).   Specialty: Obstetrics and Gynecology Contact information: [address] [place] [address] [phone]                 Signed: Sawyer Rosalva 05/02/2024, 4:09 PM

## 2024-05-02 NOTE — Transfer of Care (Signed)
 Immediate Anesthesia Transfer of Care Note  Patient: Ashley Stewart  Procedure(s) Performed: HYSTERECTOMY, TOTAL, ROBOT-ASSISTED, LAPAROSCOPIC, WITH BILATERAL SALPINGECTOMY (Pelvis)  Patient Location: PACU  Anesthesia Type:General  Level of Consciousness: drowsy, patient cooperative, and responds to stimulation  Airway & Oxygen Therapy: Patient Spontanous Breathing and Patient connected to face mask oxygen  Post-op Assessment: Report given to RN, Post -op Vital signs reviewed and stable, and Patient moving all extremities X 4  Post vital signs: Reviewed and stable  Last Vitals:  Vitals Value Taken Time  BP 149/86 05/02/24 12:30  Temp    Pulse 67 05/02/24 12:32  Resp 20 05/02/24 12:32  SpO2 100 % 05/02/24 12:32  Vitals shown include unfiled device data.  Last Pain:  Vitals:   05/02/24 0900  TempSrc: Oral  PainSc: 0-No pain      Patients Stated Pain Goal: 4 (05/02/24 0900)  Complications: No notable events documented.

## 2024-05-02 NOTE — Anesthesia Procedure Notes (Addendum)
 Procedure Name: Intubation Date/Time: 05/02/2024 10:23 AM  Performed by: Jolynn Mage, CRNAPre-anesthesia Checklist: Patient identified, Patient being monitored, Timeout performed, Emergency Drugs available and Suction available Patient Re-evaluated:Patient Re-evaluated prior to induction Oxygen Delivery Method: Circle System Utilized Preoxygenation: Pre-oxygenation with 100% oxygen Induction Type: IV induction Ventilation: Mask ventilation without difficulty Laryngoscope Size: Miller and 2 Grade View: Grade I Tube type: Oral Tube size: 7.0 mm Number of attempts: 1 Airway Equipment and Method: Stylet Placement Confirmation: ETT inserted through vocal cords under direct vision, positive ETCO2 and breath sounds checked- equal and bilateral Secured at: 21 cm Tube secured with: Tape Dental Injury: Teeth and Oropharynx as per pre-operative assessment

## 2024-05-03 ENCOUNTER — Encounter (HOSPITAL_COMMUNITY): Payer: Self-pay | Admitting: Obstetrics and Gynecology

## 2024-05-03 NOTE — Anesthesia Postprocedure Evaluation (Signed)
 Anesthesia Post Note  Patient: Ashley Stewart  Procedure(s) Performed: HYSTERECTOMY, TOTAL, ROBOT-ASSISTED, LAPAROSCOPIC, WITH BILATERAL SALPINGECTOMY (Pelvis)     Patient location during evaluation: PACU Anesthesia Type: General Level of consciousness: awake and alert Pain management: pain level controlled Vital Signs Assessment: post-procedure vital signs reviewed and stable Respiratory status: spontaneous breathing, nonlabored ventilation, respiratory function stable and patient connected to nasal cannula oxygen Cardiovascular status: blood pressure returned to baseline and stable Postop Assessment: no apparent nausea or vomiting Anesthetic complications: no   No notable events documented.  Last Vitals:  Vitals:   05/02/24 1622 05/02/24 1931  BP: (!) 149/88 (!) 145/93  Pulse: 68 75  Resp: 18   Temp: 36.9 C   SpO2: 99% 100%    Last Pain:  Vitals:   05/02/24 1846  TempSrc:   PainSc: 5                  Epifanio Lamar BRAVO

## 2024-05-04 LAB — SURGICAL PATHOLOGY

## 2024-05-24 ENCOUNTER — Inpatient Hospital Stay: Attending: Hematology & Oncology

## 2024-05-24 ENCOUNTER — Inpatient Hospital Stay (HOSPITAL_BASED_OUTPATIENT_CLINIC_OR_DEPARTMENT_OTHER): Admitting: Medical Oncology

## 2024-05-24 VITALS — BP 139/95 | HR 85 | Temp 98.5°F | Resp 16 | Wt 172.0 lb

## 2024-05-24 DIAGNOSIS — D560 Alpha thalassemia: Secondary | ICD-10-CM

## 2024-05-24 DIAGNOSIS — D509 Iron deficiency anemia, unspecified: Secondary | ICD-10-CM

## 2024-05-24 DIAGNOSIS — D649 Anemia, unspecified: Secondary | ICD-10-CM

## 2024-05-24 DIAGNOSIS — D573 Sickle-cell trait: Secondary | ICD-10-CM

## 2024-05-24 LAB — CMP (CANCER CENTER ONLY)
ALT: 12 U/L (ref 0–44)
AST: 24 U/L (ref 15–41)
Albumin: 4.6 g/dL (ref 3.5–5.0)
Alkaline Phosphatase: 70 U/L (ref 38–126)
Anion gap: 10 (ref 5–15)
BUN: 10 mg/dL (ref 6–20)
CO2: 24 mmol/L (ref 22–32)
Calcium: 9.5 mg/dL (ref 8.9–10.3)
Chloride: 107 mmol/L (ref 98–111)
Creatinine: 0.74 mg/dL (ref 0.44–1.00)
GFR, Estimated: >60 mL/min (ref >60)
Glucose, Bld: 108 mg/dL — ABNORMAL HIGH (ref 70–99)
Potassium: 4 mmol/L (ref 3.5–5.1)
Sodium: 141 mmol/L (ref 135–145)
Total Bilirubin: 1 mg/dL (ref 0.0–1.2)
Total Protein: 8.1 g/dL (ref 6.5–8.1)

## 2024-05-24 LAB — FERRITIN: Ferritin: 18 ng/mL (ref 11–307)

## 2024-05-24 LAB — CBC WITH DIFFERENTIAL (CANCER CENTER ONLY)
Abs Immature Granulocytes: 0 K/uL (ref 0.00–0.07)
Basophils Absolute: 0 K/uL (ref 0.0–0.1)
Basophils Relative: 1 %
Eosinophils Absolute: 0.1 K/uL (ref 0.0–0.5)
Eosinophils Relative: 2 %
HCT: 35 % — ABNORMAL LOW (ref 36.0–46.0)
Hemoglobin: 10.5 g/dL — ABNORMAL LOW (ref 12.0–15.0)
Immature Granulocytes: 0 %
Lymphocytes Relative: 34 %
Lymphs Abs: 1.6 K/uL (ref 0.7–4.0)
MCH: 20.9 pg — ABNORMAL LOW (ref 26.0–34.0)
MCHC: 30 g/dL (ref 30.0–36.0)
MCV: 69.6 fL — ABNORMAL LOW (ref 80.0–100.0)
Monocytes Absolute: 0.3 K/uL (ref 0.1–1.0)
Monocytes Relative: 7 %
Neutro Abs: 2.6 K/uL (ref 1.7–7.7)
Neutrophils Relative %: 56 %
Platelet Count: 320 K/uL (ref 150–400)
RBC: 5.03 MIL/uL (ref 3.87–5.11)
RDW: 26.9 % — ABNORMAL HIGH (ref 11.5–15.5)
WBC Count: 4.7 K/uL (ref 4.0–10.5)
nRBC: 0 % (ref 0.0–0.2)

## 2024-05-24 LAB — SAMPLE TO BLOOD BANK

## 2024-05-24 LAB — IRON AND IRON BINDING CAPACITY (CC-WL,HP ONLY)
Iron: 50 ug/dL (ref 28–170)
Saturation Ratios: 11 % (ref 10.4–31.8)
TIBC: 451 ug/dL — ABNORMAL HIGH (ref 250–450)
UIBC: 401 ug/dL

## 2024-05-24 NOTE — Progress Notes (Signed)
 Hematology and Oncology Follow Up Visit  Ashley Stewart [id number] 02/14/1986 38 y.o. 05/24/2024   Principle Diagnosis:  Iron  deficiency anemia  Sickle cell trait Alpha thalassemia minor   Current Therapy:        IV iron  as indicated- last infusion was on- 01/17/2024 Folic acid  1 mg PO daily    Interim History:  Ashley Stewart is here today for follow-up.    Today she states that she is doing well.   She had a partial hysterectomy on 05/02/2024. She had 2 units of blood the day prior to get her Hgb above 8 as it was 6.8 on 05/01/2024. She reports no significant bleeding during the hysterectomy and has not had any since.    Fatigue has been improved since surgery. No chest pains, SOB, peripheral edema or bleeding.   She has been treated with IV Venofer  in the past and has done well with this. Her last infusion was on 01/17/2024  She is on her folic acid  supplement  She denies fever, chills, n/v, cough, rash, dizziness, SOB, chest pain, palpitations, abdominal pain or changes in bowel or bladder habits.  No swelling, tenderness, numbness or tingling in her extremities. No falls or syncope.  She has maintained a good appetite and is doing her best to stay well hydrated.   Wt Readings from Last 3 Encounters:  05/24/24 172 lb (78 kg)  05/02/24 173 lb (78.5 kg)  02/16/24 171 lb 6.4 oz (77.7 kg)     ECOG Performance Status: 1 - Symptomatic but completely ambulatory  Medications:  Allergies as of 05/24/2024   No Known Allergies      Medication List        Accurate as of May 24, 2024 12:06 PM. If you have any questions, ask your nurse or doctor.          STOP taking these medications    Fusion Plus Caps Stopped by: Lauraine CHRISTELLA Dais   norethindrone 0.35 MG tablet Commonly known as: MICRONOR Stopped by: Lauraine CHRISTELLA Dais       TAKE these medications    acetaminophen  500 MG tablet Commonly known as: TYLENOL  Take 2 tablets (1,000 mg total) by mouth every 8  (eight) hours as needed for mild pain (pain score 1-3).   amLODipine 2.5 MG tablet Commonly known as: NORVASC Take 2.5 mg by mouth daily.   calcium carbonate 500 MG chewable tablet Commonly known as: TUMS - dosed in mg elemental calcium Chew 1 tablet by mouth as needed for indigestion or heartburn.   folic acid  1 MG tablet Commonly known as: FOLVITE  Take 1 tablet (1 mg total) by mouth daily.   ibuprofen  600 MG tablet Commonly known as: ADVIL  Take 1 tablet (600 mg total) by mouth every 6 (six) hours as needed for mild pain (pain score 1-3).   losartan  100 MG tablet Commonly known as: COZAAR  Take 100 mg by mouth daily.   oxyCODONE  5 MG immediate release tablet Commonly known as: Oxy IR/ROXICODONE  Take 1-2 tablets (5-10 mg total) by mouth every 6 (six) hours as needed for moderate pain (pain score 4-6).        Allergies: No Known Allergies  Past Medical History, Surgical history, Social history, and Family History were reviewed and updated.  Review of Systems: All other 10 point review of systems is negative.   Physical Exam:  weight is 172 lb (78 kg). Her temperature is 98.5 F (36.9 C). Her blood pressure is 139/95 (abnormal) and her pulse is  85. Her respiration is 16 and oxygen saturation is 100%.   Wt Readings from Last 3 Encounters:  05/24/24 172 lb (78 kg)  05/02/24 173 lb (78.5 kg)  02/16/24 171 lb 6.4 oz (77.7 kg)    Ocular: Sclerae unicteric, pupils equal, round and reactive to light Ear-nose-throat: Oropharynx clear, dentition fair Lymphatic: No cervical or supraclavicular adenopathy Lungs no rales or rhonchi, good excursion bilaterally Heart regular rate and rhythm, no murmur appreciated MSK no obvious joint edema Neuro: non-focal, well-oriented, appropriate affect  Lab Results  Component Value Date   WBC 2.7 (L) 05/01/2024   HGB 9.0 (L) 05/01/2024   HCT 29.9 (L) 05/01/2024   MCV 64.4 (L) 05/01/2024   PLT 305 05/01/2024   Lab Results  Component  Value Date   FERRITIN 47 02/16/2024   IRON  13 (L) 02/16/2024   TIBC 431 02/16/2024   UIBC 418 02/16/2024   IRONPCTSAT 3 (L) 02/16/2024   Lab Results  Component Value Date   RETICCTPCT 0.9 11/24/2023   RBC 3.71 (L) 05/01/2024   No results found for: KPAFRELGTCHN, LAMBDASER, KAPLAMBRATIO No results found for: IGGSERUM, IGA, IGMSERUM No results found for: STEPHANY CARLOTA BENSON MARKEL EARLA JOANNIE DOC, MSPIKE, SPEI   Chemistry      Component Value Date/Time   NA 140 05/01/2024 1000   K 3.7 05/01/2024 1000   CL 109 05/01/2024 1000   CO2 24 05/01/2024 1000   BUN 6 05/01/2024 1000   CREATININE 0.68 05/01/2024 1000   CREATININE 0.66 09/27/2023 0905      Component Value Date/Time   CALCIUM 8.6 (L) 05/01/2024 1000   ALKPHOS 50 09/27/2023 0905   AST 17 09/27/2023 0905   ALT 8 09/27/2023 0905   BILITOT 0.6 09/27/2023 0905     Encounter Diagnoses  Name Primary?   Sickle cell trait    Iron  deficiency anemia, unspecified iron  deficiency anemia type Yes   Alpha thalassemia    Symptomatic anemia      Impression and Plan: Ashley Stewart is a very pleasant 38 yo African American female with history of iron  deficiency anemia secondary to heavy cycles and both alpha thalassemia minor and sickle cell traits.  Review of her CBC shows a Hgb of 10.5- likley will see some fall in Hgb naturally as she is s/p 2 units of blood but I would expect values to be above 8.5.  Iron  studies pending Continue folic acid   Disposition:   RTC 4 weeks APP, labs (CBC w/, retic, iron , ferritin, sample)-[place]   Lauraine CHRISTELLA Dais, PA-C 11/12/202512:06 PM

## 2024-05-29 ENCOUNTER — Ambulatory Visit: Payer: Self-pay | Admitting: Medical Oncology

## 2024-05-29 ENCOUNTER — Other Ambulatory Visit: Payer: Self-pay | Admitting: Medical Oncology

## 2024-05-29 NOTE — Telephone Encounter (Signed)
-----   Message from Lauraine CHRISTELLA Dais sent at 05/29/2024  2:11 PM EST ----- Overall iron  saturation looks the best it has in a long time but this and ferritin are still lower than ideal. I would recommend IV iron . Hopefully this will help keep her hemoglobin up.   Scheduling please schedule for IV Venofer  300 mg weekly x 3 ----- Message ----- From: Interface, Lab In [place] Sent: 05/24/2024  12:21 PM EST To: Lauraine CHRISTELLA Dais, PA-C

## 2024-05-29 NOTE — Telephone Encounter (Signed)
 Call placed to patient to notify her per order of S.[place] PA that the overall iron  saturation looks the best it has in a long time but this and ferritin are still lower than ideal and that Lauraine would recommend three doses of IV iron  and that hopefully this will help keep your HGB up.  Pt states that she would like to get all three doses of iron  in prior to 06/12/24 d/t pt is going back to work on that day.  Message sent to scheduling.

## 2024-05-31 ENCOUNTER — Inpatient Hospital Stay

## 2024-05-31 VITALS — BP 136/93 | HR 79 | Temp 98.7°F | Resp 18

## 2024-05-31 DIAGNOSIS — D509 Iron deficiency anemia, unspecified: Secondary | ICD-10-CM

## 2024-05-31 MED ORDER — IRON SUCROSE 300 MG IVPB - SIMPLE MED
300.0000 mg | Freq: Once | Status: AC
  Administered 2024-05-31: 300 mg via INTRAVENOUS
  Filled 2024-05-31: qty 300

## 2024-05-31 MED ORDER — SODIUM CHLORIDE 0.9 % IV SOLN: Freq: Once | INTRAVENOUS | Status: AC

## 2024-05-31 NOTE — Patient Instructions (Signed)

## 2024-06-02 ENCOUNTER — Inpatient Hospital Stay

## 2024-06-02 VITALS — BP 140/92 | HR 78 | Temp 98.6°F | Resp 18

## 2024-06-02 DIAGNOSIS — D509 Iron deficiency anemia, unspecified: Secondary | ICD-10-CM

## 2024-06-02 MED ORDER — IRON SUCROSE 300 MG IVPB - SIMPLE MED
300.0000 mg | Freq: Once | Status: AC
  Administered 2024-06-02: 300 mg via INTRAVENOUS
  Filled 2024-06-02: qty 300

## 2024-06-02 MED ORDER — SODIUM CHLORIDE 0.9 % IV SOLN: Freq: Once | INTRAVENOUS | Status: AC

## 2024-06-02 NOTE — Patient Instructions (Signed)

## 2024-06-05 ENCOUNTER — Inpatient Hospital Stay

## 2024-06-05 VITALS — BP 123/84 | HR 68 | Temp 98.0°F | Resp 18

## 2024-06-05 DIAGNOSIS — D509 Iron deficiency anemia, unspecified: Secondary | ICD-10-CM

## 2024-06-05 MED ORDER — IRON SUCROSE 300 MG IVPB - SIMPLE MED
300.0000 mg | Freq: Once | Status: AC
  Administered 2024-06-05: 300 mg via INTRAVENOUS
  Filled 2024-06-05: qty 300

## 2024-06-05 MED ORDER — SODIUM CHLORIDE 0.9 % IV SOLN: Freq: Once | INTRAVENOUS | Status: AC

## 2024-06-05 NOTE — Patient Instructions (Signed)

## 2024-07-03 ENCOUNTER — Inpatient Hospital Stay: Admitting: Medical Oncology

## 2024-07-03 ENCOUNTER — Inpatient Hospital Stay: Attending: Hematology & Oncology
